# Patient Record
Sex: Female | Born: 1980 | Race: White | Hispanic: No | Marital: Single | Smoking: Never smoker
Health system: Southern US, Community
[De-identification: ages and names within clinical notes are randomized; demographics above are authoritative.]

## PROBLEM LIST (undated history)

## (undated) DIAGNOSIS — N840 Polyp of corpus uteri: Secondary | ICD-10-CM

## (undated) DIAGNOSIS — K6289 Other specified diseases of anus and rectum: Secondary | ICD-10-CM

## (undated) DIAGNOSIS — Z973 Presence of spectacles and contact lenses: Secondary | ICD-10-CM

## (undated) DIAGNOSIS — Z8619 Personal history of other infectious and parasitic diseases: Secondary | ICD-10-CM

## (undated) DIAGNOSIS — G8929 Other chronic pain: Secondary | ICD-10-CM

## (undated) DIAGNOSIS — Z9889 Other specified postprocedural states: Secondary | ICD-10-CM

## (undated) DIAGNOSIS — R112 Nausea with vomiting, unspecified: Secondary | ICD-10-CM

## (undated) DIAGNOSIS — R51 Headache: Secondary | ICD-10-CM

## (undated) DIAGNOSIS — F419 Anxiety disorder, unspecified: Secondary | ICD-10-CM

## (undated) HISTORY — DX: Personal history of other infectious and parasitic diseases: Z86.19

---

## 2006-03-13 ENCOUNTER — Ambulatory Visit (HOSPITAL_COMMUNITY): Admission: RE | Admit: 2006-03-13 | Discharge: 2006-03-13 | Payer: Self-pay | Admitting: Obstetrics and Gynecology

## 2010-03-06 ENCOUNTER — Encounter: Payer: Self-pay | Admitting: Obstetrics and Gynecology

## 2011-12-26 ENCOUNTER — Encounter (HOSPITAL_COMMUNITY): Payer: Self-pay | Admitting: Emergency Medicine

## 2011-12-26 ENCOUNTER — Observation Stay (HOSPITAL_COMMUNITY)
Admission: EM | Admit: 2011-12-26 | Discharge: 2011-12-27 | Disposition: A | Discharge status: Home / Self Care | Payer: Self-pay | Attending: Emergency Medicine | Admitting: Emergency Medicine

## 2011-12-26 DIAGNOSIS — Z792 Long term (current) use of antibiotics: Secondary | ICD-10-CM | POA: Insufficient documentation

## 2011-12-26 DIAGNOSIS — R300 Dysuria: Secondary | ICD-10-CM | POA: Insufficient documentation

## 2011-12-26 DIAGNOSIS — N12 Tubulo-interstitial nephritis, not specified as acute or chronic: Principal | ICD-10-CM

## 2011-12-26 DIAGNOSIS — R109 Unspecified abdominal pain: Secondary | ICD-10-CM | POA: Insufficient documentation

## 2011-12-26 DIAGNOSIS — R319 Hematuria, unspecified: Secondary | ICD-10-CM | POA: Insufficient documentation

## 2011-12-26 DIAGNOSIS — Z79899 Other long term (current) drug therapy: Secondary | ICD-10-CM | POA: Insufficient documentation

## 2011-12-26 NOTE — ED Notes (Signed)
PT. REPORTS LOW BACK PAIN FOR SEVERAL DAYS , SEEN BY OB/GYNE TODAY DIAGNOSED WITH UTI - PRESCRIBED WITH FLAGYL AND BACTRIM , SLIGHT NAUSEA WITH DIARRHEA.  UNSURE OF LMP.

## 2011-12-27 LAB — CBC WITH DIFFERENTIAL/PLATELET
Basophils Absolute: 0.1 10*3/uL (ref 0.0–0.1)
Basophils Relative: 0 % (ref 0–1)
HCT: 37.7 % (ref 36.0–46.0)
Hemoglobin: 13.1 g/dL (ref 12.0–15.0)
Lymphocytes Relative: 21 % (ref 12–46)
Lymphs Abs: 3 10*3/uL (ref 0.7–4.0)
MCH: 32.5 pg (ref 26.0–34.0)
MCHC: 34.7 g/dL (ref 30.0–36.0)
Monocytes Absolute: 1.2 10*3/uL — ABNORMAL HIGH (ref 0.1–1.0)
Monocytes Relative: 8 % (ref 3–12)
Neutro Abs: 10.3 10*3/uL — ABNORMAL HIGH (ref 1.7–7.7)
Platelets: 364 10*3/uL (ref 150–400)
WBC: 14.6 10*3/uL — ABNORMAL HIGH (ref 4.0–10.5)

## 2011-12-27 LAB — URINALYSIS, ROUTINE W REFLEX MICROSCOPIC
Protein, ur: 30 mg/dL — AB
Urobilinogen, UA: 0.2 mg/dL (ref 0.0–1.0)
pH: 7 (ref 5.0–8.0)

## 2011-12-27 LAB — BASIC METABOLIC PANEL
BUN: 13 mg/dL (ref 6–23)
CO2: 27 mEq/L (ref 19–32)
Creatinine, Ser: 0.95 mg/dL (ref 0.50–1.10)
GFR calc non Af Amer: 78 mL/min — ABNORMAL LOW (ref >90)
Glucose, Bld: 96 mg/dL (ref 70–99)
Potassium: 3.8 mEq/L (ref 3.5–5.1)
Sodium: 139 mEq/L (ref 135–145)

## 2011-12-27 LAB — URINE MICROSCOPIC-ADD ON

## 2011-12-27 MED ORDER — OXYCODONE-ACETAMINOPHEN 5-325 MG PO TABS
2.0000 | ORAL_TABLET | Freq: Once | ORAL | Status: AC
  Administered 2011-12-27: 2 via ORAL
  Filled 2011-12-27: qty 2

## 2011-12-27 MED ORDER — HYDROMORPHONE HCL PF 1 MG/ML IJ SOLN
1.0000 mg | INTRAMUSCULAR | Status: DC | PRN
  Administered 2011-12-27: 1 mg via INTRAVENOUS
  Filled 2011-12-27: qty 1

## 2011-12-27 MED ORDER — ONDANSETRON 4 MG PO TBDP: 4.0000 mg | ORAL_TABLET | Freq: Once | ORAL | Status: DC

## 2011-12-27 MED ORDER — ONDANSETRON HCL 4 MG/2ML IJ SOLN
4.0000 mg | Freq: Once | INTRAMUSCULAR | Status: AC
  Administered 2011-12-27: 4 mg via INTRAVENOUS
  Filled 2011-12-27: qty 2

## 2011-12-27 MED ORDER — LORAZEPAM 2 MG/ML IJ SOLN
1.0000 mg | Freq: Once | INTRAMUSCULAR | Status: AC
  Administered 2011-12-27: 1 mg via INTRAVENOUS
  Filled 2011-12-27: qty 1

## 2011-12-27 MED ORDER — HYDROMORPHONE HCL PF 1 MG/ML IJ SOLN
1.0000 mg | INTRAMUSCULAR | Status: AC
  Administered 2011-12-27: 1 mg via INTRAVENOUS
  Filled 2011-12-27: qty 1

## 2011-12-27 MED ORDER — HYDROCODONE-ACETAMINOPHEN 5-325 MG PO TABS: 2.0000 | ORAL_TABLET | ORAL | Status: DC | PRN

## 2011-12-27 MED ORDER — HYDROCODONE-ACETAMINOPHEN 5-325 MG PO TABS: 2.0000 | ORAL_TABLET | Freq: Once | ORAL | Status: DC

## 2011-12-27 MED ORDER — DEXTROSE 5 % IV SOLN
1.0000 g | Freq: Once | INTRAVENOUS | Status: AC
  Administered 2011-12-27: 1 g via INTRAVENOUS
  Filled 2011-12-27: qty 10

## 2011-12-27 MED ORDER — ONDANSETRON 4 MG PO TBDP
4.0000 mg | ORAL_TABLET | Freq: Once | ORAL | Status: AC
  Administered 2011-12-27: 4 mg via ORAL
  Filled 2011-12-27: qty 1

## 2011-12-27 MED ORDER — CIPROFLOXACIN HCL 500 MG PO TABS: 500.0000 mg | ORAL_TABLET | Freq: Two times a day (BID) | ORAL | Status: DC

## 2011-12-27 MED ORDER — CIPROFLOXACIN IN D5W 400 MG/200ML IV SOLN
400.0000 mg | Freq: Two times a day (BID) | INTRAVENOUS | Status: DC
  Administered 2011-12-27: 400 mg via INTRAVENOUS
  Filled 2011-12-27: qty 200

## 2011-12-27 MED ORDER — SODIUM CHLORIDE 0.9 % IV BOLUS (SEPSIS)
1000.0000 mL | Freq: Once | INTRAVENOUS | Status: AC
  Administered 2011-12-27: 1000 mL via INTRAVENOUS

## 2011-12-27 NOTE — ED Notes (Signed)
Pt states she is starting to feel better but "is over the eating thing". She states she tried to eat some crackers but didn't feel like it.

## 2011-12-27 NOTE — Progress Notes (Signed)
Utilization review completed.  

## 2011-12-27 NOTE — ED Notes (Signed)
Pt moved to room 17B and report given to POD B RN

## 2011-12-27 NOTE — ED Provider Notes (Signed)
Ms. Judith Jackson, is a 31 year old female in the CDU on pyelonephritis protocol. Patient was signed out to me by Dr. Dierdre Highman.  8:17 AM Patient reevaluated, she states that she is feeling better, but is still nervous that her pain is going to return.  Denies headache, chest pain, shortness of breath, nausea, vomiting, abdominal pain, constipation, diarrhea, dysuria, back pain, peripheral edema, numbness and tingling of the extremities.  She states that she can still feel a little bit of pain in her left flank. Reports small a urine.  PE: Gen: A&O x4 HEENT: PERRL, EOM CHEST: RRR, no m/r/g LUNGS: CTAB, no w/r/r ABD: BS x 4, ND/NT EXT: No edema, strong peripheral pulses NEURO: Sensation and strength intact bilaterally  Plan: By mouth challenge, reassure her, and discharged with Cipro.  9:46 AM Patient reevaluated. She states that she is starting to have return of pain. Also endorses nausea with eating. I'm going to give Zofran and Vicodin and see if her symptoms resolve.  1:34 PM Patient reports that she is feeling better. She does not have a ride home, and must drive herself. Will hold her for another hour as last dose of hydrocodone he was at 10:00.  2:45 PM Patient reports that she is feeling better. I'm going to discharge her to home with pain medicine and Cipro. Patient is agreeable with this. I will have the patient follow up with her primary care. Patient is stable and ready for discharge.  Roxy Horseman, PA-C 12/27/11 1446

## 2011-12-27 NOTE — ED Notes (Signed)
Rob, PA back in to speak with patient

## 2011-12-27 NOTE — ED Notes (Signed)
Pt states when she sits up she becomes dizzy and funny feeling but that it didn't start until after she received the last dose of medicine. States that she would like to try to fall asleep so that it might go away.

## 2011-12-27 NOTE — ED Provider Notes (Signed)
History     CSN: [id number]  Arrival date & time 12/26/11  2314   First MD Initiated Contact with Patient 12/26/11 2339      Chief Complaint  Patient presents with  . Back Pain    (Consider location/radiation/quality/duration/timing/severity/associated sxs/prior treatment) HPI Comments: Judith Jackson 31 y.o. female   The chief complaint is: Patient presents with:   Back Pain    Cc Back pain. 8/10. Nothing makes it worse  Or better. Tried heating pad without relief.  Onset Saturday in Left flank.  Gradually moved to left flank.  Pain gradually worsening.   Pain with urination, foul odor of urine 2 weeks ago.  + suprapubic pain and + N -V.  + pink urine last week/  Denies fevers, chills, myalgias, arthralgias. Denies DOE, SOB, chest tightness or pressure, radiation to left arm, jaw or bac, or diaphoresis. Denies headaches, light headedness, weakness, visual disturbances. Denies abdominal pain, nausea, kvomiting, diarrhea or constipation.      The history is provided by the patient. No language interpreter was used.    History reviewed. No pertinent past medical history.  History reviewed. No pertinent past surgical history.  No family history on file.  History  Substance Use Topics  . Smoking status: Never Smoker   . Smokeless tobacco: Not on file  . Alcohol Use: No    OB History    Grav Para Term Preterm Abortions TAB SAB Ect Mult Living                  Review of Systems  Constitutional: Negative.   HENT: Negative.   Eyes: Negative.   Respiratory: Negative.   Cardiovascular: Negative.   Gastrointestinal: Positive for abdominal pain.       Suprapubic   Genitourinary: Positive for dysuria, hematuria and flank pain.  Musculoskeletal: Negative.   Skin: Negative.   Neurological: Negative.   All other systems reviewed and are negative.    Allergies  Demerol  Home Medications   Current Outpatient Rx  Name  Route  Sig  Dispense  Refill  .  METRONIDAZOLE 500 MG PO TABS   Oral   Take 500 mg by mouth 2 (two) times daily.         . SULFAMETHOXAZOLE-TMP DS 800-160 MG PO TABS   Oral   Take 1 tablet by mouth 2 (two) times daily.           BP 146/90  Pulse 89  Temp 98.5 F (36.9 C) (Oral)  Resp 18  SpO2 98%  Physical Exam  Constitutional: She is oriented to person, place, and time. She appears well-developed and well-nourished. No distress.  HENT:  Head: Normocephalic and atraumatic.  Eyes: Conjunctivae normal are normal. No scleral icterus.  Neck: Normal range of motion.  Cardiovascular: Normal rate, regular rhythm and normal heart sounds.  Exam reveals no gallop and no friction rub.   No murmur heard. Pulmonary/Chest: Effort normal and breath sounds normal. No respiratory distress.  Abdominal: Soft. Bowel sounds are normal. She exhibits no distension and no mass. There is tenderness. There is no guarding.       Positive CVA angle tenderness.  Neurological: She is alert and oriented to person, place, and time.  Skin: Skin is warm and dry. She is not diaphoretic.    ED Course  Procedures (including critical care time) Results for orders placed during the hospital encounter of 12/26/11  URINALYSIS, ROUTINE W REFLEX MICROSCOPIC      Component Value Range  Color, Urine YELLOW  YELLOW   APPearance CLOUDY (*) CLEAR   Specific Gravity, Urine 1.021  1.005 - 1.030   pH 7.0  5.0 - 8.0   Glucose, UA NEGATIVE  NEGATIVE mg/dL   Hgb urine dipstick MODERATE (*) NEGATIVE   Bilirubin Urine NEGATIVE  NEGATIVE   Ketones, ur NEGATIVE  NEGATIVE mg/dL   Protein, ur 30 (*) NEGATIVE mg/dL   Urobilinogen, UA 0.2  0.0 - 1.0 mg/dL   Nitrite NEGATIVE  NEGATIVE   Leukocytes, UA MODERATE (*) NEGATIVE  POCT PREGNANCY, URINE      Component Value Range   Preg Test, Ur NEGATIVE  NEGATIVE  URINE MICROSCOPIC-ADD ON      Component Value Range   Squamous Epithelial / LPF FEW (*) RARE   WBC, UA 11-20  <3 WBC/hpf   RBC / HPF 3-6  <3  RBC/hpf   Bacteria, UA FEW (*) RARE   Urine-Other AMORPHOUS URATES/PHOSPHATES    CBC WITH DIFFERENTIAL      Component Value Range   WBC 14.6 (*) 4.0 - 10.5 K/uL   RBC 4.03  3.87 - 5.11 MIL/uL   Hemoglobin 13.1  12.0 - 15.0 g/dL   HCT 40.9  81.1 - 91.4 %   MCV 93.5  78.0 - 100.0 fL   MCH 32.5  26.0 - 34.0 pg   MCHC 34.7  30.0 - 36.0 g/dL   RDW 78.2  95.6 - 21.3 %   Platelets 364  150 - 400 K/uL   Neutrophils Relative 70  43 - 77 %   Neutro Abs 10.3 (*) 1.7 - 7.7 K/uL   Lymphocytes Relative 21  12 - 46 %   Lymphs Abs 3.0  0.7 - 4.0 K/uL   Monocytes Relative 8  3 - 12 %   Monocytes Absolute 1.2 (*) 0.1 - 1.0 K/uL   Eosinophils Relative 1  0 - 5 %   Eosinophils Absolute 0.2  0.0 - 0.7 K/uL   Basophils Relative 0  0 - 1 %   Basophils Absolute 0.1  0.0 - 0.1 K/uL  BASIC METABOLIC PANEL      Component Value Range   Sodium 139  135 - 145 mEq/L   Potassium 3.8  3.5 - 5.1 mEq/L   Chloride 101  96 - 112 mEq/L   CO2 27  19 - 32 mEq/L   Glucose, Bld 96  70 - 99 mg/dL   BUN 13  6 - 23 mg/dL   Creatinine, Ser 0.86  0.50 - 1.10 mg/dL   Calcium 9.7  8.4 - 57.8 mg/dL   GFR calc non Af Amer 78 (*) >90 mL/min   GFR calc Af Amer >90  >90 mL/min     No results found.   No diagnosis found.    MDM  Patient with Positive UTI and sig. Pain.  Denies vaginal sxs.  Poor insight.  Had pelvic done today at ob/gyn and gc/chlamydia cultures.  VSS but positive CVA angle tenderness and likely Pyelo.  Sig Pain.  I will give IV Rocephin.    Filed Vitals:   12/27/11 0215 12/27/11 0230 12/27/11 0300 12/27/11 0309  BP: 112/63 99/63 103/60   Pulse: 69 64 61   Temp:    98.8 F (37.1 C)  TempSrc:    Rectal  Resp:      SpO2: 98% 98% 97%     Patient with likley Pyelo.  I am moving her to CDU on Pyelo protocol pending Iv ABX. Patient has  gotten 1 dose of rocephin IV.  WIl receive 1 dose IV cipro in the AM.   Arthor Captain, PA-C 12/27/11 1434

## 2011-12-27 NOTE — ED Notes (Signed)
Pt requesting a female MD to do pelvic exam. MD notified

## 2011-12-27 NOTE — ED Notes (Signed)
Pt is here with lower back and had some diarrhea since Saturday.  Patient moved to CDU.  Pt is nauseated and only pain is lower back.

## 2011-12-27 NOTE — ED Notes (Signed)
MD at bedside. 

## 2011-12-28 LAB — URINE CULTURE

## 2011-12-28 NOTE — ED Provider Notes (Signed)
Medical screening examination/treatment/procedure(s) were conducted as a shared visit with non-physician practitioner(s) and myself.  I personally evaluated the patient during the encounter. PT evaluated for back pain and dysuria, has L side CVAT, heart RRR, lungs CTA. ABD s/n/nd. IVFs, Iv ABx. Pain medications provided.  Observation Protocol  Sunnie Nielsen, MD 12/28/11 [phone]

## 2011-12-28 NOTE — ED Provider Notes (Signed)
Medical screening examination/treatment/procedure(s) were conducted as a shared visit with non-physician practitioner(s) and myself.  I personally evaluated the patient during the encounter  Sunnie Nielsen, MD 12/28/11 [phone]

## 2012-05-11 ENCOUNTER — Emergency Department (HOSPITAL_COMMUNITY)
Admission: EM | Admit: 2012-05-11 | Discharge: 2012-05-12 | Disposition: A | Discharge status: Home / Self Care | Payer: Self-pay | Attending: Emergency Medicine | Admitting: Emergency Medicine

## 2012-05-11 ENCOUNTER — Encounter (HOSPITAL_COMMUNITY): Payer: Self-pay | Admitting: *Deleted

## 2012-05-11 DIAGNOSIS — Z7251 High risk heterosexual behavior: Secondary | ICD-10-CM | POA: Insufficient documentation

## 2012-05-11 DIAGNOSIS — A630 Anogenital (venereal) warts: Secondary | ICD-10-CM

## 2012-05-11 DIAGNOSIS — K5900 Constipation, unspecified: Secondary | ICD-10-CM | POA: Insufficient documentation

## 2012-05-11 DIAGNOSIS — K6289 Other specified diseases of anus and rectum: Secondary | ICD-10-CM

## 2012-05-11 DIAGNOSIS — N898 Other specified noninflammatory disorders of vagina: Secondary | ICD-10-CM | POA: Insufficient documentation

## 2012-05-11 DIAGNOSIS — K602 Anal fissure, unspecified: Secondary | ICD-10-CM

## 2012-05-11 LAB — WET PREP, GENITAL
Trich, Wet Prep: NONE SEEN
Yeast Wet Prep HPF POC: NONE SEEN

## 2012-05-11 LAB — URINALYSIS, ROUTINE W REFLEX MICROSCOPIC
Bilirubin Urine: NEGATIVE
Glucose, UA: NEGATIVE mg/dL
Protein, ur: NEGATIVE mg/dL
Urobilinogen, UA: 0.2 mg/dL (ref 0.0–1.0)

## 2012-05-11 LAB — COMPREHENSIVE METABOLIC PANEL
AST: 15 U/L (ref 0–37)
Alkaline Phosphatase: 74 U/L (ref 39–117)
Calcium: 10.1 mg/dL (ref 8.4–10.5)
Creatinine, Ser: 0.78 mg/dL (ref 0.50–1.10)
GFR calc Af Amer: >90 mL/min (ref >90)

## 2012-05-11 LAB — CBC
MCHC: 34.1 g/dL (ref 30.0–36.0)
RBC: 4.09 MIL/uL (ref 3.87–5.11)
RDW: 12.8 % (ref 11.5–15.5)

## 2012-05-11 LAB — URINE MICROSCOPIC-ADD ON

## 2012-05-11 LAB — PREGNANCY, URINE: Preg Test, Ur: NEGATIVE

## 2012-05-11 MED ORDER — LIDOCAINE 5 % EX OINT: TOPICAL_OINTMENT | CUTANEOUS | Status: DC | PRN

## 2012-05-11 MED ORDER — DOCUSATE SODIUM 100 MG PO CAPS: 100.0000 mg | ORAL_CAPSULE | Freq: Two times a day (BID) | ORAL | Status: DC

## 2012-05-11 NOTE — ED Notes (Signed)
Pt reports having rectal pain x 2 weeks when having a bowel movement, reports its sharp like a razor blade and now having rectal bleeding. Reports something is hanging out of rectum, possible hemorrhoid. Pt is also requesting female provider.

## 2012-05-11 NOTE — ED Provider Notes (Signed)
History     CSN: [id number]  Arrival date & time 05/11/12  1706   First MD Initiated Contact with Patient 05/11/12 2003      Chief Complaint  Patient presents with  . Rectal Bleeding    (Consider location/radiation/quality/duration/timing/severity/associated sxs/prior treatment) HPI Comments: Patient presents for 2 weeks of rectal pain and BRBPR with bowel movements. She states that when she wipes she has noticed bright red blood on her toilet tissue. Per patient, symptoms have been unchanged since onset without any aggravating or alleviating factors. Patient admits to associated constipation, stating that she frequently is constipated and sometimes goes ~3 days without a BM; last BM this AM. Patient denies fever, chest pain, abdominal pain, pelvic pain, vaginal bleeding, dysuria, hematuria, melena, and numbness or tingling in her extremities. She later admits to having some white mucousy-like d/c x 1 week and admits to unprotected sex with 2 partners in the last year. Patient denies seeing an OBGYN for regularly scheduled pap smears in "years".  The history is provided by the patient. No language interpreter was used.    History reviewed. No pertinent past medical history.  History reviewed. No pertinent past surgical history.  History reviewed. No pertinent family history.  History  Substance Use Topics  . Smoking status: Never Smoker   . Smokeless tobacco: Not on file  . Alcohol Use: No    OB History   Grav Para Term Preterm Abortions TAB SAB Ect Mult Living                  Review of Systems  Constitutional: Negative for fever and chills.  Respiratory: Negative for chest tightness and shortness of breath.   Cardiovascular: Negative for chest pain.  Gastrointestinal: Positive for constipation and anal bleeding. Negative for nausea, vomiting, abdominal pain and diarrhea.  Genitourinary: Positive for vaginal discharge. Negative for dysuria, hematuria, flank pain, vaginal  bleeding, vaginal pain and pelvic pain.  Musculoskeletal: Negative for back pain.  Neurological: Negative for dizziness, syncope, weakness, light-headedness and numbness.  All other systems reviewed and are negative.    Allergies  Demerol  Home Medications   Current Outpatient Rx  Name  Route  Sig  Dispense  Refill  . ibuprofen (ADVIL,MOTRIN) 200 MG tablet   Oral   Take 600 mg by mouth every 6 (six) hours as needed for pain.         . pseudoephedrine (SUDAFED) 30 MG tablet   Oral   Take 30 mg by mouth every 4 (four) hours as needed for congestion.         . docusate sodium (COLACE) 100 MG capsule   Oral   Take 1 capsule (100 mg total) by mouth every 12 (twelve) hours.   30 capsule   0   . lidocaine (XYLOCAINE) 5 % ointment   Topical   Apply topically as needed.   35.44 g   0     BP 102/70  Pulse 79  Temp(Src) 98.3 F (36.8 C) (Oral)  Resp 16  SpO2 99%  LMP 04/29/2012  Physical Exam  Nursing note and vitals reviewed. Constitutional: She is oriented to person, place, and time. She appears well-developed and well-nourished.  HENT:  Head: Normocephalic and atraumatic.  Mouth/Throat: Oropharynx is clear and moist. No oropharyngeal exudate.  Eyes: Conjunctivae are normal. Pupils are equal, round, and reactive to light. No scleral icterus.  Neck: Normal range of motion. Neck supple.  Cardiovascular: Normal rate, regular rhythm, normal heart sounds and intact  distal pulses.   Pulmonary/Chest: Effort normal and breath sounds normal. No respiratory distress. She has no wheezes. She has no rales.  Abdominal: Soft. Bowel sounds are normal. She exhibits no distension. There is no tenderness. There is no rebound and no guarding.  Genitourinary: Rectal exam shows fissure. Rectal exam shows no external hemorrhoid, no internal hemorrhoid and anal tone normal. There is no rash, tenderness or lesion on the right labia. There is no rash or lesion on the left labia. Uterus is  not tender. Cervix exhibits discharge. Cervix exhibits no motion tenderness and no friability. Right adnexum displays no mass, no tenderness and no fullness. Left adnexum displays no mass, no tenderness and no fullness. No erythema, tenderness or bleeding around the vagina. No foreign body around the vagina. No signs of injury around the vagina. Vaginal discharge found.  Patient with opaque mucousy discharge from cervix and in vaginal vault; no CMT or adnexal tenderness.  Three anal warts appreciated at the anterior aspect of the patient's rectum. Anal fissure also appreciated at posterior aspect of patient's rectum with mild bleeding. Rectal tone normal and stool brown in color; streaked with bright red blood from patient's mildly bleeding fissure.  Musculoskeletal: Normal range of motion. She exhibits no edema.  Lymphadenopathy:    She has no cervical adenopathy.  Neurological: She is alert and oriented to person, place, and time.  Skin: Skin is warm and dry.  Psychiatric: She has a normal mood and affect. Her behavior is normal.    ED Course  Procedures (including critical care time)  Labs Reviewed  WET PREP, GENITAL - Abnormal; Notable for the following:    WBC, Wet Prep HPF POC MANY (*)    All other components within normal limits  URINALYSIS, ROUTINE W REFLEX MICROSCOPIC - Abnormal; Notable for the following:    APPearance TURBID (*)    Hgb urine dipstick MODERATE (*)    Leukocytes, UA MODERATE (*)    All other components within normal limits  OCCULT BLOOD, POC DEVICE - Abnormal; Notable for the following:    Fecal Occult Bld POSITIVE (*)    All other components within normal limits  GC/CHLAMYDIA PROBE AMP  CBC  COMPREHENSIVE METABOLIC PANEL  URINE MICROSCOPIC-ADD ON  PREGNANCY, URINE   No results found.   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 Rectal pain   3. Anal fissure      MDM  Patient presents with rectal pain with defecation and BRBPR x 2 weeks. Anal fissure  appreciated on posterior aspect of patient's rectum, mildly bleeding, consistent with patient's symptoms. Hemodynamically stable without anemia. Patient's exam also significant for condyloma acuminata on the anterior aspect of her rectum requiring follow up with dermatology. No CMT or adnexal tenderness on pelvic exam; GC/Chlamydia cultures pending. Patient stable for d/c with PCP, OBGYN, and dermatology follow up as well as colace and lidocaine ointment for symptomatic improvement. Patient instructed to f/u regarding GC/Chlamdia results. Patient states comfort and understanding with this d/c plan with no unaddressed concerns. Indications for ED return discussed. Patient work up and ED management discussed with Dr. Blinda Leatherwood who is in agreement.        Antony Madura, PA-C 05/12/12 1301

## 2012-05-11 NOTE — ED Notes (Signed)
Patient requested to see female provider, no active bleeding at this time.  Patient does report having constipation and straining while attempting to have bowel movement.  Patient also is c/o of white foul smelling discharge.

## 2012-05-12 ENCOUNTER — Telehealth (HOSPITAL_COMMUNITY): Payer: Self-pay | Admitting: Emergency Medicine

## 2012-05-12 NOTE — ED Notes (Signed)
Calling about test results

## 2012-05-12 NOTE — ED Provider Notes (Signed)
Medical screening examination/treatment/procedure(s) were performed by non-physician practitioner and as supervising physician I was immediately available for consultation/collaboration.   Christopher J. Pollina, MD 05/12/12 1608 

## 2012-05-13 LAB — GC/CHLAMYDIA PROBE AMP: CT Probe RNA: NEGATIVE

## 2012-05-15 ENCOUNTER — Telehealth (HOSPITAL_COMMUNITY): Payer: Self-pay | Admitting: Emergency Medicine

## 2012-05-15 NOTE — ED Notes (Signed)
Pt calling for STD results.  ID verified w/DOB and address.  Pt informed STD results negative.

## 2012-06-24 ENCOUNTER — Encounter: Payer: Self-pay | Admitting: Obstetrics and Gynecology

## 2012-06-24 NOTE — Progress Notes (Signed)
Patient ID: Judith Jackson, female   DOB: [birth year], 32 y.o.   MRN: [id number] [facility] referred patient to see Dr. Stan Head.  Records were sent over for review.  Per Dr. Stan Head we do not treat rectal warts, patient would be best served seeing Dr. Romie Levee at [facility].  This information was given to [facility] that works with Dr. Nils Flack along with phone and fax numbers for [facility].  Melissa said she will notify patient of this information and she will work on getting an appointment set up for patient with [facility].

## 2012-07-12 ENCOUNTER — Ambulatory Visit (INDEPENDENT_AMBULATORY_CARE_PROVIDER_SITE_OTHER): Payer: BC Managed Care – PPO | Admitting: General Surgery

## 2012-07-12 ENCOUNTER — Encounter (INDEPENDENT_AMBULATORY_CARE_PROVIDER_SITE_OTHER): Payer: Self-pay | Admitting: General Surgery

## 2012-07-12 VITALS — BP 110/70 | HR 72 | Temp 98.0°F | Resp 18 | Ht 62.0 in | Wt 145.0 lb

## 2012-07-12 DIAGNOSIS — A630 Anogenital (venereal) warts: Secondary | ICD-10-CM

## 2012-07-12 NOTE — Progress Notes (Signed)
Chief Complaint  Patient presents with  . Other    Anal Condyloma    HISTORY: Judith Jackson is a 32 y.o. female who presents to the office with rectal bleeding and anal condyloma.  Other symptoms include pain.  This had been occurring for several months.  She has tried chemical ablation and cryosurgery in the past with no success.  Nothing makes the symptoms worse.   It is intermittent in nature.  Her bowel habits are reg and her bowel movements are soft.  She denies any risk factors for HIV.  No past medical history     No past surgical history    No current outpatient prescriptions on file.   No current facility-administered medications for this visit.      Allergies  Allergen Reactions  . Demerol (Meperidine) Other (See Comments)    halluninations      No family history on file.  History   Social History  . Marital Status: Divorced    Spouse Name: N/A    Number of Children: N/A  . Years of Education: N/A   Social History Main Topics  . Smoking status: Never Smoker   . Smokeless tobacco: None  . Alcohol Use: Yes  . Drug Use: No  . Sexually Active: None   Other Topics Concern  . None   Social History Narrative  . None      REVIEW OF SYSTEMS - PERTINENT POSITIVES ONLY: Review of Systems - General ROS: negative for - chills, fever or weight loss Hematological and Lymphatic ROS: negative for - bleeding problems, blood clots or bruising Respiratory ROS: no cough, shortness of breath, or wheezing Cardiovascular ROS: no chest pain or dyspnea on exertion Gastrointestinal ROS: no abdominal pain, change in bowel habits, or black or bloody stools Genito-Urinary ROS: no dysuria, trouble voiding, or hematuria  EXAM: Filed Vitals:   07/12/12 1601  BP: 110/70  Pulse: 72  Temp: 98 F (36.7 C)  Resp: 18    General appearance: alert and cooperative Resp: clear to auscultation bilaterally Cardio: regular rate and rhythm GI: soft, non-tender; bowel sounds normal; no  masses,  no organomegaly   Procedure: Anoscopy Surgeon: Maisie Fus Diagnosis: anal warts  Assistant: Christella Scheuermann After the risks and benefits were explained, verbal consent was obtained for above procedure  Anesthesia: none Findings: anal condyloma, mild sphincter hypertension, anterior internal lesion.    ASSESSMENT AND PLAN: Judith Jackson is a 32 y.o. F with anal condyloma.  These have not gotten better with conservative treatments.  I have recommended a HRA with laser ablation.  We discussed the management of anal warts. We discussed chemical destruction, immunotherapy, and surgical excision. I discussed the pros and cons of each approach. We discussed the risk and benefits and the expected outcome with chemical destruction with agents such as podophyllin. I explained that podophyllin is generally not been effective and has a high recurrence rate. We discussed the use of Aldara ointment. I explained that it has a 30-70% chance at resolving or at least reducing the number of anal warts. I explained that it is applied 3 times a week at night and left on overnight. I explained that skin irritation is the most common side effect. We then discussed surgical excision specifically excision and fulguration. I explained how the surgery is performed. I explained that it can be painful however it generally has the highest success rate. We discussed the risk and benefits of surgery including but not limited to bleeding, infection, injury to surrounding  structures, need to do a formal anoscopic exam to evaluate for anal canal warts, urinary retention, wart recurrence, and general anesthesia risk. We discussed the typical aftercare.      Vanita Panda, MD Colon and Rectal Surgery / [facility]      Visit Diagnoses: 1. Anal condyloma     Primary Care Physician: No primary provider on file.

## 2012-07-12 NOTE — Patient Instructions (Addendum)
Anal Warts  What are anal warts? Anal warts (also called "condyloma acuminata") are a condition that affects the area around and inside the anus. They may also affect the skin of the genital area. They first appear as tiny spots or growths, perhaps as small as the head of a pin, and may grow quite large and cover the entire anal area. Usually, they do not cause pain or discomfort to afflicted individuals and patients may be unaware that the warts are present. Some patients will experience symptoms, such as itching, bleeding, mucus discharge and/or a feeling of a lump or mass in the anal area.  What causes anal warts? They are caused by the human papilloma virus (HPV), which is transmitted from person to person by direct contact. HPV is considered a sexually transmitted disease (STD). You do not have to have anal intercourse to develop anal warts. Do anal warts always need to be removed? Yes. If they are not removed, the warts usually grow larger and multiply. Left untreated, the warts may lead to an increased risk of cancer in the affected area. What treatments are available? If warts are very small and are located only on the skin around the anus, they may be treated with a topical medication. They may also be treated by freezing the warts with liquid nitrogen or removed surgically. Surgery typically involves cutting or burning the warts off. While this provides immediate results, it must be performed using either a local anesthetic - such as novocaine - or a general or spinal anesthetic, depending on the number and exact location of warts being treated. It is important that an internal anal examination with an instrument called an anoscope be done by your treating physician to ensure you do not have any inside the anal canal (internal anal warts). Internal anal warts may not be as suitable for treatment by topical medications, and may need to be treated surgically. Additionally, your physician may wish to  examine the entire pelvic region to include the vaginal or penile area to look for other warts that may require treatment. Must I be hospitalized for surgical treatment? Surgical treatment of anal warts is usually performed as outpatient surgery. How much time will I lose from work after surgical treatment? Most people are moderately uncomfortable for a few days after treatment and pain medication may be prescribed. Depending on the extent of the disease, some people return to work the next day, while others may remain out of work for several days to weeks. Will a single treatment cure the problem? When warts are extensive, your surgeon may wish to perform the surgery in stages. Additionally, recurrent warts are common. The virus that causes the warts can live concealed in tissues that appear normal for several months before another wart develops. As new warts develop, they usually can be treated in the physician's office. Sometimes new warts develop so rapidly that office treatment would be quite uncomfortable. In these situations, a second and, occasionally, third outpatient surgical visit may be recommended. How long is treatment usually continued? Follow-up visits are necessary at frequent intervals for several months after all warts appear to be gone, to be certain that no new warts occur. What can be done to avoid getting these warts again? In some cases, warts may recur repeatedly after successful removal, since the virus that causes the warts often persists in a dormant state in body tissues. Discuss with your physician how often you should be evaluated for recurrent warts. Abstain from sexual   contact with individuals who have anal (or genital) warts. Since many individuals may be unaware that they suffer from this condition, sexual abstinence, condom protection or limiting sexual contact to single partner will reduce your potential exposure to the contagious virus that causes these warts. As a  precaution, sexual partners ought to be checked for warts and other sexual transmitted diseases, even if they have no symptoms. What is a colon and rectal surgeon? Colon and rectal surgeons are experts in the surgical and non-surgical treatment of diseases of the colon, rectum and anus. They have completed advanced surgical training in the treatment of these diseases as well as full general surgical training. Board-certified colon and rectal surgeons complete residencies in general surgery and colon and rectal surgery, and pass intensive examinations conducted by the American Board of Surgery and the American Board of Colon and Rectal Surgery. They are well-versed in the treatment of both benign and malignant diseases of the colon, rectum and anus and are able to perform routine screening examinations and surgically treat conditions if indicated to do so. author: Jennifer Lowney, MD, FASCRS, on behalf of the ASCRS Public Relations Committee  2012 American Society of Colon & Rectal Surgeons   

## 2012-09-27 ENCOUNTER — Telehealth (INDEPENDENT_AMBULATORY_CARE_PROVIDER_SITE_OTHER): Payer: Self-pay | Admitting: General Surgery

## 2012-09-27 NOTE — Telephone Encounter (Signed)
Pt advised of financial obligation for surgery / aware that orders only active for 90 days then need to see Dr

## 2012-10-10 ENCOUNTER — Emergency Department (INDEPENDENT_AMBULATORY_CARE_PROVIDER_SITE_OTHER)
Admission: EM | Admit: 2012-10-10 | Discharge: 2012-10-10 | Disposition: A | Discharge status: Home / Self Care | Payer: BC Managed Care – PPO | Source: Home / Self Care | Attending: Family Medicine | Admitting: Family Medicine

## 2012-10-10 ENCOUNTER — Encounter (HOSPITAL_COMMUNITY): Payer: Self-pay | Admitting: *Deleted

## 2012-10-10 DIAGNOSIS — M542 Cervicalgia: Secondary | ICD-10-CM

## 2012-10-10 DIAGNOSIS — R110 Nausea: Secondary | ICD-10-CM

## 2012-10-10 DIAGNOSIS — M549 Dorsalgia, unspecified: Secondary | ICD-10-CM

## 2012-10-10 MED ORDER — GI COCKTAIL ~~LOC~~
ORAL | Status: AC
  Filled 2012-10-10: qty 30

## 2012-10-10 MED ORDER — ONDANSETRON HCL 4 MG PO TABS: 4.0000 mg | ORAL_TABLET | Freq: Four times a day (QID) | ORAL | Status: DC

## 2012-10-10 MED ORDER — DICLOFENAC POTASSIUM 50 MG PO TABS: 50.0000 mg | ORAL_TABLET | Freq: Three times a day (TID) | ORAL | Status: DC

## 2012-10-10 MED ORDER — GI COCKTAIL ~~LOC~~
30.0000 mL | Freq: Once | ORAL | Status: AC
  Administered 2012-10-10: 30 mL via ORAL

## 2012-10-10 MED ORDER — ONDANSETRON 4 MG PO TBDP
4.0000 mg | ORAL_TABLET | Freq: Once | ORAL | Status: AC
  Administered 2012-10-10: 4 mg via ORAL

## 2012-10-10 MED ORDER — ACETAMINOPHEN 325 MG PO TABS
ORAL_TABLET | ORAL | Status: AC
  Filled 2012-10-10: qty 3

## 2012-10-10 MED ORDER — ACETAMINOPHEN 325 MG PO TABS
975.0000 mg | ORAL_TABLET | Freq: Once | ORAL | Status: AC
  Administered 2012-10-10: 975 mg via ORAL

## 2012-10-10 MED ORDER — ONDANSETRON 4 MG PO TBDP
ORAL_TABLET | ORAL | Status: AC
  Filled 2012-10-10: qty 1

## 2012-10-10 MED ORDER — CYCLOBENZAPRINE HCL 5 MG PO TABS: 5.0000 mg | ORAL_TABLET | Freq: Three times a day (TID) | ORAL | Status: DC | PRN
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
## 2012-11-13 ENCOUNTER — Encounter (INDEPENDENT_AMBULATORY_CARE_PROVIDER_SITE_OTHER): Payer: Self-pay | Admitting: General Surgery

## 2012-11-13 ENCOUNTER — Ambulatory Visit (INDEPENDENT_AMBULATORY_CARE_PROVIDER_SITE_OTHER): Payer: BC Managed Care – PPO | Admitting: General Surgery

## 2012-11-13 ENCOUNTER — Encounter (INDEPENDENT_AMBULATORY_CARE_PROVIDER_SITE_OTHER): Payer: Self-pay

## 2012-11-13 VITALS — BP 122/70 | HR 84 | Resp 16 | Ht 62.0 in | Wt 148.2 lb

## 2012-11-13 DIAGNOSIS — K645 Perianal venous thrombosis: Secondary | ICD-10-CM

## 2012-11-13 MED ORDER — HYDROCORTISONE 2.5 % RE CREA: TOPICAL_CREAM | Freq: Two times a day (BID) | RECTAL | Status: DC

## 2012-11-13 MED ORDER — LIDOCAINE 5 % EX OINT: TOPICAL_OINTMENT | CUTANEOUS | Status: DC | PRN

## 2012-11-13 NOTE — Patient Instructions (Signed)
Fiber Chart  You should 25-30g of fiber per day and drinking 8 glasses of water to help your bowels move regularly.  In the chart below you can look up how much fiber you are getting in an average day.  If you are not getting enough fiber, you should add a fiber supplement to your diet.  Examples of this include Metamucil, FiberCon and Citrucel.  These can be purchased at your local grocery store or pharmacy.      http://www.[employer].edu/offices/health/nutritioncoach/AtoZ/handouts/Fiber.pdf   GETTING TO GOOD BOWEL HEALTH. Irregular bowel habits such as constipation can lead to many problems over time.  Having one soft bowel movement a day is the most important way to prevent further problems.  The anorectal canal is designed to handle stretching and feces to safely manage our ability to get rid of solid waste (feces, poop, stool) out of our body.  BUT, hard constipated stools can act like ripping concrete bricks causing inflamed hemorrhoids, anal fissures, abdominal pain and bloating.     The goal: ONE SOFT BOWEL MOVEMENT A DAY!  To have soft, regular bowel movements:    Drink at least 8 tall glasses of water a day.     Take plenty of fiber.  Fiber is the undigested part of plant food that passes into the colon, acting s "natures broom" to encourage bowel motility and movement.  Fiber can absorb and hold large amounts of water. This results in a larger, bulkier stool, which is soft and easier to pass. Work gradually over several weeks up to 6 servings a day of fiber (25g a day even more if needed) in the form of: o Vegetables -- Root (potatoes, carrots, turnips), leafy green (lettuce, salad greens, celery, spinach), or cooked high residue (cabbage, broccoli, etc) o Fruit -- Fresh (unpeeled skin & pulp), Dried (prunes, apricots, cherries, etc ),  or stewed ( applesauce)  o Whole grain breads, pasta, etc (whole wheat)  o Bran cereals    Bulking Agents -- This type of water-retaining fiber generally is  easily obtained each day by one of the following:  o Psyllium bran -- The psyllium plant is remarkable because its ground seeds can retain so much water. This product is available as Metamucil, Konsyl, Effersyllium, Per Diem Fiber, or the less expensive generic preparation in drug and health food stores. Although labeled a laxative, it really is not a laxative.  o Methylcellulose -- This is another fiber derived from wood which also retains water. It is available as Citrucel. o Polyethylene Glycol - and "artificial" fiber commonly called Miralax or Glycolax.  It is helpful for people with gassy or bloated feelings with regular fiber o Flax Seed - a less gassy fiber than psyllium   No reading or other relaxing activity while on the toilet. If bowel movements take longer than 5 minutes, you are too constipated   AVOID CONSTIPATION.  High fiber and water intake usually takes care of this.  Sometimes a laxative is needed to stimulate more frequent bowel movements, but    Laxatives are not a good long-term solution as it can wear the colon out. o Osmotics (Milk of Magnesia, Fleets phosphosoda, Magnesium citrate, MiraLax, GoLytely) are safer than  o Stimulants (Senokot, Castor Oil, Dulcolax, Ex Lax)    o Do not take laxatives for more than 7days in a row.    IF SEVERELY CONSTIPATED, try a Bowel Retraining Program: o Do not use laxatives.  o Eat a diet high in roughage, such as   bran cereals and leafy vegetables.  o Drink six (6) ounces of prune or apricot juice each morning.  o Eat two (2) large servings of stewed fruit each day.  o Take one (1) heaping tablespoon of a psyllium-based bulking agent twice a day. Use sugar-free sweetener when possible to avoid excessive calories.  o Eat a normal breakfast.  o Set aside 15 minutes after breakfast to sit on the toilet, but do not strain to have a bowel movement.  o If you do not have a bowel movement by the third day, use an enema and repeat the above steps.     HEMORRHOIDS    Did you know... Hemorrhoids are one of the most common ailments known.  More than half the population will develop hemorrhoids, usually after age 30.  Millions of Americans currently suffer from hemorrhoids.  The average person suffers in silence for a long period before seeking medical care.  Today's treatment methods make some types of hemorrhoid removal much less painful.  What are hemorrhoids? Often described as "varicose veins of the anus and rectum", hemorrhoids are enlarged, bulging blood vessels in and about the anus and lower rectum. There are two types of hemorrhoids: external and internal, which refer to their location.  External (outside) hemorrhoids develop near the anus and are covered by very sensitive skin. These are usually painless. However, if a blood clot (thrombosis) develops in an external hemorrhoid, it becomes a painful, hard lump. The external hemorrhoid may bleed if it ruptures. Internal (inside) hemorrhoids develop within the anus beneath the lining. Painless bleeding and protrusion during bowel movements are the most common symptom. However, an internal hemorrhoid can cause severe pain if it is completely "prolapsed" - protrudes from the anal opening and cannot be pushed back inside.   What causes hemorrhoids? An exact cause is unknown; however, the upright posture of humans alone forces a great deal of pressure on the rectal veins, which sometimes causes them to bulge. Other contributing factors include:  . Aging  . Chronic constipation or diarrhea  . Pregnancy  . Heredity  . Straining during bowel movements  . Faulty bowel function due to overuse of laxatives or enemas . Spending long periods of time (e.g., reading) on the toilet  Whatever the cause, the tissues supporting the vessels stretch. As a result, the vessels dilate; their walls become thin and bleed. If the stretching and pressure continue, the weakened vessels protrude.  What are  the symptoms? If you notice any of the following, you could have hemorrhoids:  . Bleeding during bowel movements  . Protrusion during bowel movements . Itching in the anal area  . Pain  . Sensitive lump(s)  How are hemorrhoids treated? Mild symptoms can be relieved frequently by increasing the amount of fiber (e.g., fruits, vegetables, breads and cereals) and fluids in the diet. Eliminating excessive straining reduces the pressure on hemorrhoids and helps prevent them from protruding. A sitz bath - sitting in plain warm water for about 10 minutes - can also provide some relief . With these measures, the pain and swelling of most symptomatic hemorrhoids will decrease in two to seven days, and the firm lump should recede within four to six weeks. In cases of severe or persistent pain from a thrombosed hemorrhoid, your physician may elect to remove the hemorrhoid containing the clot with a small incision. Performed under local anesthesia as an outpatient, this procedure generally provides relief. Severe hemorrhoids may require special treatment, much of which can   be performed on an outpatient basis.  . Ligation - the rubber band treatment - works effectively on internal hemorrhoids that protrude with bowel movements. A small rubber band is placed over the hemorrhoid, cutting off its blood supply. The hemorrhoid and the band fall off in a few days and the wound usually heals in a week or two. This procedure sometimes produces mild discomfort and bleeding and may need to be repeated for a full effect.  There is a more intense version of this procedure that is done in the OR as outpatient surgery called THD.  It involves identifying blood vessels leading to the hemorrhoids and then tying them off with sutures.  This method is a little more painful than rubber band ligation but less painful than traditional hemorrhoidectomy and usually does not have to be repeated.  It is best for internal hemorrhoids that  bleed.  Rubber Band Ligation of Internal Hemorrhoids:  A.  Bulging, bleeding, internal hemorrhoid B.  Rubber band applied at the base of the hemorrhoid C.  About 7 days later, the banded hemorrhoid has fallen off leaving a small scar (arrow)  . Injection and Coagulation can also be used on bleeding hemorrhoids that do not protrude. Both methods are relatively painless and cause the hemorrhoid to shrivel up. . Hemorrhoidectomy - surgery to remove the hemorrhoids - is the most complete method for removal of internal and external hemorrhoids. It is necessary when (1) clots repeatedly form in external hemorrhoids; (2) ligation fails to treat internal hemorrhoids; (3) the protruding hemorrhoid cannot be reduced; or (4) there is persistent bleeding. A hemorrhoidectomy removes excessive tissue that causes the bleeding and protrusion. It is done under anesthesia using sutures, and may, depending upon circumstances, require hospitalization and a period of inactivity. Laser hemorrhoidectomies do not offer any advantage over standard operative techniques. They are also quite expensive, and contrary to popular belief, are no less painful.  Do hemorrhoids lead to cancer? No. There is no relationship between hemorrhoids and cancer. However, the symptoms of hemorrhoids, particularly bleeding, are similar to those of colorectal cancer and other diseases of the digestive system. Therefore, it is important that all symptoms are investigated by a physician specially trained in treating diseases of the colon and rectum and that everyone 50 years or older undergo screening tests for colorectal cancer. Do not rely on over-the-counter medications or other self-treatments. See a colorectal surgeon first so your symptoms can be properly evaluated and effective treatment prescribed.  2012 American Society of Colon & Rectal Surgeons     

## 2012-11-13 NOTE — Progress Notes (Signed)
   Tylin Stradley is a 32 y.o. 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  She is having increasing pain and difficulty with BM's for the past few weeks.  She has tried occasional sitz baths with no help.  She is having occasional drainage and bleeding as well.  Objective: Filed Vitals:   11/13/12 1556  BP: 122/70  Pulse: 84  Resp: 16    General appearance: alert and cooperative GI: normal findings: soft, non-tender   Assessment and Plan: Neylan Koroma is a 32 y.o. F with condyloma.  She now has a thrombosed hemorrhoid that is getting better.  I have given her instructions to help manage her bowel movements.  I will have her start on a stool softener and fiber supplement as well.  We will schedule surgery to address her condyloma and possible thrombosed hemorrhoid.  I will plan on doing a laser ablation of her condyloma. If she still has external hemorrhoid disease we will perform hemorrhoidectomy at the same time. The risks of surgery were signed the patient which mainly include bleeding, recurrence of hemorrhoids and pain.    Vanita Panda, MD [facility], [place] [phone]

## 2012-11-15 ENCOUNTER — Encounter (HOSPITAL_BASED_OUTPATIENT_CLINIC_OR_DEPARTMENT_OTHER): Payer: Self-pay | Admitting: Anesthesiology

## 2012-11-15 ENCOUNTER — Ambulatory Visit (HOSPITAL_BASED_OUTPATIENT_CLINIC_OR_DEPARTMENT_OTHER): Payer: BC Managed Care – PPO | Admitting: Anesthesiology

## 2012-11-15 ENCOUNTER — Encounter (HOSPITAL_BASED_OUTPATIENT_CLINIC_OR_DEPARTMENT_OTHER)
Admission: RE | Disposition: A | Discharge status: Home / Self Care | Payer: Self-pay | Source: Ambulatory Visit | Attending: General Surgery

## 2012-11-15 ENCOUNTER — Ambulatory Visit (HOSPITAL_BASED_OUTPATIENT_CLINIC_OR_DEPARTMENT_OTHER)
Admission: RE | Admit: 2012-11-15 | Discharge: 2012-11-15 | Disposition: A | Discharge status: Home / Self Care | Payer: BC Managed Care – PPO | Source: Ambulatory Visit | Attending: General Surgery | Admitting: General Surgery

## 2012-11-15 DIAGNOSIS — A630 Anogenital (venereal) warts: Secondary | ICD-10-CM | POA: Insufficient documentation

## 2012-11-15 DIAGNOSIS — K645 Perianal venous thrombosis: Secondary | ICD-10-CM | POA: Insufficient documentation

## 2012-11-15 DIAGNOSIS — K602 Anal fissure, unspecified: Secondary | ICD-10-CM | POA: Insufficient documentation

## 2012-11-15 HISTORY — DX: Headache: R51

## 2012-11-15 HISTORY — PX: EVALUATION UNDER ANESTHESIA WITH HEMORRHOIDECTOMY: SHX5624

## 2012-11-15 HISTORY — DX: Anxiety disorder, unspecified: F41.9

## 2012-11-15 SURGERY — EXAM UNDER ANESTHESIA WITH HEMORRHOIDECTOMY
Anesthesia: General | Site: Rectum | Wound class: Clean Contaminated

## 2012-11-15 MED ORDER — MIDAZOLAM HCL 5 MG/5ML IJ SOLN
INTRAMUSCULAR | Status: DC | PRN
  Administered 2012-11-15: 2 mg via INTRAVENOUS

## 2012-11-15 MED ORDER — PROPOFOL 10 MG/ML IV BOLUS
INTRAVENOUS | Status: DC | PRN
  Administered 2012-11-15: 200 mg via INTRAVENOUS

## 2012-11-15 MED ORDER — FIBER PO POWD: 17.0000 g | Freq: Two times a day (BID) | ORAL | Status: DC

## 2012-11-15 MED ORDER — OXYCODONE HCL 5 MG PO TABS: 5.0000 mg | ORAL_TABLET | Freq: Four times a day (QID) | ORAL | Status: DC | PRN

## 2012-11-15 MED ORDER — DOCUSATE SODIUM 100 MG PO CAPS: 100.0000 mg | ORAL_CAPSULE | Freq: Two times a day (BID) | ORAL | Status: DC

## 2012-11-15 MED ORDER — DEXAMETHASONE SODIUM PHOSPHATE 4 MG/ML IJ SOLN
INTRAMUSCULAR | Status: DC | PRN
  Administered 2012-11-15: 10 mg via INTRAVENOUS

## 2012-11-15 MED ORDER — ACETIC ACID 5 % SOLN
Status: DC | PRN
  Administered 2012-11-15: 1 via TOPICAL

## 2012-11-15 MED ORDER — BUPIVACAINE-EPINEPHRINE 0.5% -1:200000 IJ SOLN
INTRAMUSCULAR | Status: DC | PRN
  Administered 2012-11-15: 25 mL

## 2012-11-15 MED ORDER — FENTANYL CITRATE 0.05 MG/ML IJ SOLN
INTRAMUSCULAR | Status: DC | PRN
  Administered 2012-11-15 (×2): 50 ug via INTRAVENOUS

## 2012-11-15 MED ORDER — ONDANSETRON HCL 4 MG/2ML IJ SOLN
INTRAMUSCULAR | Status: DC | PRN
  Administered 2012-11-15: 4 mg via INTRAVENOUS

## 2012-11-15 MED ORDER — LACTATED RINGERS IV SOLN
INTRAVENOUS | Status: DC
  Administered 2012-11-15 (×2): via INTRAVENOUS
  Filled 2012-11-15: qty 1000

## 2012-11-15 MED ORDER — LIDOCAINE HCL (CARDIAC) 20 MG/ML IV SOLN
INTRAVENOUS | Status: DC | PRN
  Administered 2012-11-15: 60 mg via INTRAVENOUS

## 2012-11-15 MED ORDER — MIDAZOLAM HCL 2 MG/2ML IJ SOLN
2.0000 mg | Freq: Once | INTRAMUSCULAR | Status: AC
  Administered 2012-11-15 (×2): 1 mg via INTRAVENOUS
  Filled 2012-11-15: qty 2

## 2012-11-15 MED ORDER — SUCCINYLCHOLINE CHLORIDE 20 MG/ML IJ SOLN
INTRAMUSCULAR | Status: DC | PRN
  Administered 2012-11-15: 100 mg via INTRAVENOUS

## 2012-11-15 MED ORDER — LIDOCAINE 5 % EX OINT
TOPICAL_OINTMENT | CUTANEOUS | Status: DC | PRN
  Administered 2012-11-15: 1

## 2012-11-15 MED ORDER — POLYETHYLENE GLYCOL 3350 17 GM/SCOOP PO POWD: 8.5000 g | Freq: Every day | ORAL | Status: DC | PRN

## 2012-11-15 SURGICAL SUPPLY — 45 items
BLADE HEX COATED 2.75 (ELECTRODE) ×3 IMPLANT
BLADE SURG 15 STRL LF DISP TIS (BLADE) ×2 IMPLANT
BLADE SURG 15 STRL SS (BLADE) ×1
BRIEF STRETCH FOR OB PAD LRG (UNDERPADS AND DIAPERS) ×6 IMPLANT
CANISTER SUCTION 2500CC (MISCELLANEOUS) ×3 IMPLANT
CLOTH BEACON ORANGE TIMEOUT ST (SAFETY) ×3 IMPLANT
COVER MAYO STAND STRL (DRAPES) IMPLANT
COVER TABLE BACK 60X90 (DRAPES) ×3 IMPLANT
DECANTER SPIKE VIAL GLASS SM (MISCELLANEOUS) ×3 IMPLANT
DRAPE LG THREE QUARTER DISP (DRAPES) ×6 IMPLANT
DRAPE PED LAPAROTOMY (DRAPES) ×3 IMPLANT
DRAPE UNDERBUTTOCKS STRL (DRAPE) IMPLANT
DRSG PAD ABDOMINAL 8X10 ST (GAUZE/BANDAGES/DRESSINGS) ×3 IMPLANT
ELECT REM PT RETURN 9FT ADLT (ELECTROSURGICAL) ×3
ELECTRODE REM PT RTRN 9FT ADLT (ELECTROSURGICAL) ×2 IMPLANT
GAUZE SPONGE 4X4 12PLY STRL LF (GAUZE/BANDAGES/DRESSINGS) ×3 IMPLANT
GAUZE SPONGE 4X4 16PLY XRAY LF (GAUZE/BANDAGES/DRESSINGS) IMPLANT
GAUZE VASELINE 3X9 (GAUZE/BANDAGES/DRESSINGS) IMPLANT
GLOVE BIO SURGEON STRL SZ 6.5 (GLOVE) ×6 IMPLANT
GLOVE BIOGEL PI IND STRL 7.0 (GLOVE) ×4 IMPLANT
GLOVE BIOGEL PI INDICATOR 7.0 (GLOVE) ×2
GOWN PREVENTION PLUS LG XLONG (DISPOSABLE) ×3 IMPLANT
GOWN PREVENTION PLUS XLARGE (GOWN DISPOSABLE) ×3 IMPLANT
GOWN STRL REIN XL XLG (GOWN DISPOSABLE) ×3 IMPLANT
LEGGING LITHOTOMY PAIR STRL (DRAPES) IMPLANT
NDL SAFETY ECLIPSE 18X1.5 (NEEDLE) IMPLANT
NEEDLE HYPO 18GX1.5 SHARP (NEEDLE)
NEEDLE HYPO 25X1 1.5 SAFETY (NEEDLE) ×3 IMPLANT
NS IRRIG 500ML POUR BTL (IV SOLUTION) ×3 IMPLANT
PACK BASIN DAY SURGERY FS (CUSTOM PROCEDURE TRAY) ×3 IMPLANT
PENCIL BUTTON HOLSTER BLD 10FT (ELECTRODE) ×3 IMPLANT
SPONGE GAUZE 4X4 12PLY (GAUZE/BANDAGES/DRESSINGS) IMPLANT
SPONGE SURGIFOAM ABS GEL 12-7 (HEMOSTASIS) IMPLANT
SUT CHROMIC 2 0 SH (SUTURE) IMPLANT
SUT CHROMIC 3 0 SH 27 (SUTURE) IMPLANT
SUT MON AB 3-0 SH 27 (SUTURE)
SUT MON AB 3-0 SH27 (SUTURE) IMPLANT
SUT VIC AB 4-0 P-3 18XBRD (SUTURE) IMPLANT
SUT VIC AB 4-0 P3 18 (SUTURE)
SYR CONTROL 10ML LL (SYRINGE) ×3 IMPLANT
TOWEL OR 17X24 6PK STRL BLUE (TOWEL DISPOSABLE) ×3 IMPLANT
TRAY DSU PREP LF (CUSTOM PROCEDURE TRAY) ×3 IMPLANT
TUBE CONNECTING 12X1/4 (SUCTIONS) ×3 IMPLANT
VACUUM HOSE 7/8X10 W/ WAND (MISCELLANEOUS) ×3 IMPLANT
YANKAUER SUCT BULB TIP NO VENT (SUCTIONS) IMPLANT

## 2012-11-15 NOTE — Transfer of Care (Signed)
Immediate Anesthesia Transfer of Care Note  Patient: Judith Jackson  Procedure(s) Performed: Procedure(s) (LRB): EXAM UNDER ANESTHESIA WITH  HEMORRHOIDECTOMY (N/A)  Patient Location: PACU  Anesthesia Type: General  Level of Consciousness: awake, alert  and oriented  Airway & Oxygen Therapy: Patient Spontanous Breathing and Patient connected to face mask oxygen  Post-op Assessment: Report given to PACU RN and Post -op Vital signs reviewed and stable  Post vital signs: Reviewed and stable  Complications: No apparent anesthesia complications

## 2012-11-15 NOTE — H&P (Signed)
  Judith Jackson is a 32 y.o. 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 She is having increasing pain and difficulty with BM's for the past few weeks. She has tried occasional sitz baths with no help. She is having occasional drainage and bleeding as well.   Past Medical History  Diagnosis Date  . Anxiety   . Headache(784.0)     sinus/ migraine headaches   History reviewed. No pertinent past surgical history. Current facility-administered medications:lactated ringers infusion, , Intravenous, Continuous, Romie Levee, MD, Last Rate: 50 mL/hr at 11/15/12 1127 Allergies  Allergen Reactions  . Demerol [Meperidine] Other (See Comments)    halluninations   History   Social History  . Marital Status: Divorced    Spouse Name: N/A    Number of Children: N/A  . Years of Education: N/A   Occupational History  . Not on file.   Social History Main Topics  . Smoking status: Never Smoker   . Smokeless tobacco: Not on file  . Alcohol Use: No     Comment: socially  . Drug Use: No  . Sexual Activity: Not on file   Other Topics Concern  . Not on file   Social History Narrative  . No narrative on file   History reviewed. No pertinent family history. Review of Systems  Constitutional: Negative for fever and chills.  Respiratory: Negative for cough and shortness of breath.   Cardiovascular: Negative for chest pain.  Gastrointestinal: Negative for nausea, vomiting and abdominal pain.  Genitourinary: Negative for dysuria, urgency and frequency.  Skin: Negative for rash.  Neurological: Negative for headaches.    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 She now has a thrombosed hemorrhoid. I have given her instructions  to help manage her bowel movements, although she hasn't started this yet. I will have her start on a stool softener and fiber supplement twice daily. We will schedule surgery to address her condyloma and possible thrombosed hemorrhoid. I will plan on doing a laser ablation of her condyloma. If she still has external hemorrhoid disease we will perform hemorrhoidectomy at the same time. The risks of surgery were signed the patient which mainly include bleeding, recurrence of hemorrhoids and pain.

## 2012-11-15 NOTE — Op Note (Signed)
11/15/2012  1:30 PM  PATIENT:  Judith Jackson  32 y.o. female  Patient Care Team: No Pcp Per Patient as PCP - General (General Practice) Nils Flack, MD as Referring Physician (Dermatology)  PRE-OPERATIVE DIAGNOSIS: h/o anal condyloma, thrombosed external hemorrhoids  POST-OPERATIVE DIAGNOSIS:  Thrombosed external hemorrhoids, posterior and anterior anal fissures  PROCEDURE:  Procedure(s): EXAM UNDER ANESTHESIA WITH  Removal of thrombosed hemorrhoid  SURGEON:  Surgeon(s): Romie Levee, MD  ASSISTANT: none   ANESTHESIA:   local and general  EBL:  Total I/O In: 1000 [I.V.:1000] Out: -   DRAINS: none   SPECIMEN:  No Specimen  DISPOSITION OF SPECIMEN:  N/A  COUNTS:  YES  PLAN OF CARE: Discharge to home after PACU  PATIENT DISPOSITION:  PACU - hemodynamically stable.  INDICATION: this is a 32 year old female who presents today with a thrombosed hemorrhoid and acute anal pain. She has a known history of anal condyloma and it was decided to do an exam under anesthesia with any removal of anal condyloma if necessary as well as remove her thrombosis.  OR FINDINGS: no definitive anal condyloma noted. Thrombosed left lateral external hemorrhoid. Posterior and anterior acute anal fissures.  DESCRIPTION: the patient was identified in the preoperative holding area and taken to the OR where she was laid supine on the operating room table. General anesthesia was smoothly induced. She was then placed prone on the operating room table in jackknife position and properly padded. She was prepped and draped in usual sterile fashion. A surgical timeout was performed indicating the correct patient, procedure, positioning a preoperative antibiotic. SCDs were in place prior to the initiation of anesthesia. After this was completed I began to examine her anal canal. There was a posterior and anterior anal fissure that appeared acute in nature. There was no evidence of sphincter hypertension. A  Hill-Ferguson anoscope was placed into the anal canal and this appeared normal with no signs of mucosal inflammation or hemorrhoid disease.There was one small area that appeared to be a condyloma on a stalk. This was easily trimmed with Metzenbaum scissors. I identified no other condyloma. An acetic acid soaked sponge was placed in the anal canal and allowed to sit for approximately 1 minute. There was some mild acetowhite areas in the anal canal. No distinct lesions were seen though. I decided to forego laser ablation at this time. I turned my attention to the vaginal canal.  Upon external inspection I saw no other signs of condyloma. I then excised the top portion of the skin around the thrombosed hemorrhoid. After this was completed the thrombosis was removed completely. The area was then sutured back together with 3-0 chromic suture. A rectal block was performed using Marcaine with epinephrine. Hemostasis was achieved with direct pressure. Lidocaine ointment and a sterile dressing were applied. The patient was awakened from anesthesia and sent to the post anesthesia care in stable condition. All counts were correct per operating room staff.

## 2012-11-15 NOTE — Anesthesia Postprocedure Evaluation (Signed)
Anesthesia Post Note  Patient: Judith Jackson  Procedure(s) Performed: Procedure(s) (LRB): EXAM UNDER ANESTHESIA WITH  HEMORRHOIDECTOMY (N/A)  Anesthesia type: General  Patient location: PACU  Post pain: Pain level controlled  Post assessment: Post-op Vital signs reviewed  Last Vitals: BP 132/76  Pulse 106  Temp(Src) 37 C (Oral)  Resp 16  Wt 146 lb 8 oz (66.452 kg)  BMI 26.79 kg/m2  SpO2 100%  LMP 10/28/2012  Post vital signs: Reviewed  Level of consciousness: sedated  Complications: No apparent anesthesia complications

## 2012-11-15 NOTE — Anesthesia Preprocedure Evaluation (Addendum)
Anesthesia Evaluation  Patient identified by MRN, date of birth, ID band Patient awake    Reviewed: Allergy & Precautions, H&P , NPO status , Patient's Chart, lab work & pertinent test results  Airway Mallampati: II TM Distance: >3 FB Neck ROM: Full    Dental no notable dental hx.    Pulmonary neg pulmonary ROS,  breath sounds clear to auscultation  Pulmonary exam normal       Cardiovascular negative cardio ROS  Rhythm:Regular Rate:Normal     Neuro/Psych negative neurological ROS  negative psych ROS   GI/Hepatic negative GI ROS, Neg liver ROS,   Endo/Other  negative endocrine ROS  Renal/GU negative Renal ROS  negative genitourinary   Musculoskeletal negative musculoskeletal ROS (+)   Abdominal   Peds negative pediatric ROS (+)  Hematology negative hematology ROS (+)   Anesthesia Other Findings   Reproductive/Obstetrics negative OB ROS                           Anesthesia Physical Anesthesia Plan  ASA: I  Anesthesia Plan: General   Post-op Pain Management:    Induction: Intravenous  Airway Management Planned: Oral ETT  Additional Equipment:   Intra-op Plan:   Post-operative Plan:   Informed Consent: I have reviewed the patients History and Physical, chart, labs and discussed the procedure including the risks, benefits and alternatives for the proposed anesthesia with the patient or authorized representative who has indicated his/her understanding and acceptance.   Dental advisory given  Plan Discussed with: CRNA  Anesthesia Plan Comments:        Anesthesia Quick Evaluation

## 2012-11-18 ENCOUNTER — Encounter (HOSPITAL_BASED_OUTPATIENT_CLINIC_OR_DEPARTMENT_OTHER): Payer: Self-pay | Admitting: General Surgery

## 2012-11-18 ENCOUNTER — Telehealth (INDEPENDENT_AMBULATORY_CARE_PROVIDER_SITE_OTHER): Payer: Self-pay

## 2012-11-18 NOTE — Telephone Encounter (Signed)
Pt calling in today 3 days s/p thrombosed hemorrhoidectomy. Pt states that the lidocaine stings when she places it on the area.  Pt also concerned that she has a new hemorrhoid / condyloma.  After she explained what she was feeling to me, and reading the op notes, it sounds more like a skin tag from surgery.  Advised pt that the lidocaine may sting for a while, especially since the area if full of nerve endings and she did just have surgery, but that it is meant to numb the area.  Pt states that she is taking sitz baths but they are uncomfortable.  Pt also stated that it hurts to have a bowel movement and that she has to drink "clear liquids" just in order to have one.  Advised pt that she should increase her water intake to help loosen her stools.  Pt verbalized understanding.

## 2012-11-19 ENCOUNTER — Telehealth (INDEPENDENT_AMBULATORY_CARE_PROVIDER_SITE_OTHER): Payer: Self-pay

## 2012-11-19 ENCOUNTER — Encounter (INDEPENDENT_AMBULATORY_CARE_PROVIDER_SITE_OTHER): Payer: Self-pay

## 2012-11-19 NOTE — Telephone Encounter (Signed)
Patient states she needs a return to work note,she asking for letter not to have MD name on it and asking for it to mail to her home. Letter mailed

## 2012-11-28 ENCOUNTER — Encounter (INDEPENDENT_AMBULATORY_CARE_PROVIDER_SITE_OTHER): Payer: Self-pay | Admitting: General Surgery

## 2012-11-28 ENCOUNTER — Ambulatory Visit (INDEPENDENT_AMBULATORY_CARE_PROVIDER_SITE_OTHER): Payer: BC Managed Care – PPO | Admitting: General Surgery

## 2012-11-28 ENCOUNTER — Encounter (INDEPENDENT_AMBULATORY_CARE_PROVIDER_SITE_OTHER): Payer: BC Managed Care – PPO | Admitting: General Surgery

## 2012-11-28 ENCOUNTER — Encounter (INDEPENDENT_AMBULATORY_CARE_PROVIDER_SITE_OTHER): Payer: Self-pay

## 2012-11-28 VITALS — BP 110/62 | HR 84 | Resp 20 | Ht 62.0 in | Wt 151.0 lb

## 2012-11-28 DIAGNOSIS — Z9889 Other specified postprocedural states: Secondary | ICD-10-CM

## 2012-11-28 NOTE — Patient Instructions (Signed)
Continue fiber and stool softeners.  Use Miralax if unable to have a bowel movement.  Call the office if your symptoms are not better in 4 weeks.

## 2012-11-28 NOTE — Progress Notes (Signed)
Judith Jackson is a 32 y.o. female who is status post a hemorrhoidectomy on 10/3.  She is feeling better.  She still has some itching and burning and pain with BMs  Objective: Filed Vitals:   11/28/12 1627  BP: 110/62  Pulse: 84  Resp: 20    General appearance: alert and cooperative GI: normal findings: soft, non-tender  Incision: healing well, still with a small area of nonhealing skin at the posterior anal canal. No residual hemorrhoid disease or condyloma noted.   Assessment: s/p  Patient Active Problem List   Diagnosis Date Noted  . Anal condyloma 07/12/2012    Plan: Continue bowel regimen. Call the office if symptoms have not resolved within the next 4 weeks.    Vanita Panda, MD [facility], [place] [phone]   11/28/2012 4:46 PM

## 2012-12-12 ENCOUNTER — Emergency Department (INDEPENDENT_AMBULATORY_CARE_PROVIDER_SITE_OTHER): Payer: BC Managed Care – PPO

## 2012-12-12 ENCOUNTER — Emergency Department (HOSPITAL_COMMUNITY)
Admission: EM | Admit: 2012-12-12 | Discharge: 2012-12-12 | Disposition: A | Discharge status: Home / Self Care | Payer: BC Managed Care – PPO | Source: Home / Self Care | Attending: Family Medicine | Admitting: Family Medicine

## 2012-12-12 ENCOUNTER — Encounter (HOSPITAL_COMMUNITY): Payer: Self-pay | Admitting: Emergency Medicine

## 2012-12-12 DIAGNOSIS — S20219A Contusion of unspecified front wall of thorax, initial encounter: Secondary | ICD-10-CM

## 2012-12-12 DIAGNOSIS — W1809XA Striking against other object with subsequent fall, initial encounter: Secondary | ICD-10-CM

## 2012-12-12 DIAGNOSIS — S20211A Contusion of right front wall of thorax, initial encounter: Secondary | ICD-10-CM

## 2012-12-12 DIAGNOSIS — M62838 Other muscle spasm: Secondary | ICD-10-CM

## 2012-12-12 MED ORDER — MELOXICAM 15 MG PO TABS: 15.0000 mg | ORAL_TABLET | Freq: Every day | ORAL | Status: DC | PRN

## 2012-12-12 MED ORDER — KETOROLAC TROMETHAMINE 60 MG/2ML IM SOLN
60.0000 mg | Freq: Once | INTRAMUSCULAR | Status: AC
  Administered 2012-12-12: 60 mg via INTRAMUSCULAR

## 2012-12-12 MED ORDER — KETOROLAC TROMETHAMINE 60 MG/2ML IM SOLN
INTRAMUSCULAR | Status: AC
  Filled 2012-12-12: qty 2

## 2012-12-12 MED ORDER — OXYCODONE-ACETAMINOPHEN 5-325 MG PO TABS: 1.0000 | ORAL_TABLET | Freq: Four times a day (QID) | ORAL | Status: DC | PRN

## 2012-12-12 MED ORDER — CYCLOBENZAPRINE HCL 10 MG PO TABS: 10.0000 mg | ORAL_TABLET | Freq: Every evening | ORAL | Status: DC | PRN

## 2012-12-12 NOTE — ED Notes (Addendum)
Pt reports she tripped and fell Monday night onto carpet flooring and hit a wooden table Right flank/rib cage hurts when she breaths in and pain increases w/activity Also has a bruised right arm/elbow Denies head inj/LOC She is alert w/no signs of acute distress... Slow steady gait.

## 2012-12-12 NOTE — ED Provider Notes (Signed)
Judith Jackson is a 32 y.o. female who presents to Urgent Care today for falls and right-sided pain. Patient tripped and fell Monday landing on a wooden table. She hit her right arm and right rib since and flank on the table. She notes severe pain in her right flank and right lateral and posterior ribs. The pain is worse with standing and deep inspiration. Additionally she notes bruising and pain at the distal humerus on the right and into the elbow. This is worse with palpation. She denies any difficulty with elbow motion. She does note pain with shoulder motion but no significant shoulder pain with shoulder motion. She denies any significant central neck pain radiating pain weakness or numbness. She denies any bowel bladder dysfunction. She has been taking ibuprofen which has not helped much. She denies any loss of consciousness.   Past Medical History  Diagnosis Date  . Anxiety   . Headache(784.0)     sinus/ migraine headaches   History  Substance Use Topics  . Smoking status: Never Smoker   . Smokeless tobacco: Not on file  . Alcohol Use: No     Comment: socially   ROS as above Medications reviewed. No current facility-administered medications for this encounter.   Current Outpatient Prescriptions  Medication Sig Dispense Refill  . cyclobenzaprine (FLEXERIL) 10 MG tablet Take 1 tablet (10 mg total) by mouth at bedtime as needed for muscle spasms.  20 tablet  0  . docusate sodium (COLACE) 100 MG capsule Take 1 capsule (100 mg total) by mouth 2 (two) times daily.    0  . Fiber POWD Take 17 g by mouth 2 (two) times daily.    0  . ibuprofen (ADVIL,MOTRIN) 200 MG tablet Take 600 mg by mouth every 6 (six) hours as needed for pain.      Marland Kitchen lidocaine (XYLOCAINE) 5 % ointment Apply topically as needed.  35.44 g  0  . meloxicam (MOBIC) 15 MG tablet Take 1 tablet (15 mg total) by mouth daily as needed for pain.  30 tablet  0  . ondansetron (ZOFRAN) 4 MG tablet Take 1 tablet (4 mg total) by mouth  every 6 (six) hours.  8 tablet  0  . oxyCODONE-acetaminophen (PERCOCET/ROXICET) 5-325 MG per tablet Take 1 tablet by mouth every 6 (six) hours as needed for pain.  20 tablet  0  . polyethylene glycol powder (MIRALAX) powder Take 8.5-34 g by mouth daily as needed (Constipation). To correct constipation.  Adjust dose over 1-2 months.  Goal = ~1 bowel movement / day  255 g  0    Exam:  BP 126/85  Pulse 61  Temp(Src) 98.4 F (36.9 C) (Oral)  Resp 18  SpO2 100%  LMP 12/12/2012 Gen: Well NAD HEENT: EOMI,  MMM Lungs: CTABL Nl WOB Heart: RRR no MRG Abd: NABS, NT, ND Exts: Non edematous BL  LE, warm and well perfused.   EXAM:  RIGHT ELBOW - COMPLETE 3+ VIEW  COMPARISON: None.  FINDINGS:  There is no evidence of fracture, dislocation, or joint effusion.  There is no evidence of arthropathy or other focal bone abnormality.  Soft tissues are unremarkable.  IMPRESSION:  No acute abnormality noted.  EXAM:  RIGHT RIBS AND CHEST - 3+ VIEW  COMPARISON: None.  FINDINGS:  The heart and pulmonary vascularity are within normal limits. The  lungs are clear bilaterally. No pneumothorax is seen. No acute  displaced or deforming rib fracture is noted. No other focal  abnormality is seen.  IMPRESSION:  No acute abnormality noted.   Assessment and Plan: 32 y.o. female with contusion of ribs.  Plan to treat with Flexeril, meloxicam, oxycodone. Followup if not improving.  Discussed warning signs or symptoms. Please see discharge instructions. Patient expresses understanding.      Rodolph Bong, MD 12/15/12 [phone]

## 2012-12-19 ENCOUNTER — Other Ambulatory Visit: Payer: Self-pay

## 2012-12-27 ENCOUNTER — Other Ambulatory Visit: Payer: Self-pay

## 2013-01-13 ENCOUNTER — Telehealth (INDEPENDENT_AMBULATORY_CARE_PROVIDER_SITE_OTHER): Payer: Self-pay

## 2013-01-13 NOTE — Telephone Encounter (Signed)
Message copied by Ivory Broad on Mon Jan 13, 2013  3:27 PM ------      Message from: Rise Paganini      Created: Mon Jan 13, 2013 12:23 PM      Regarding: Glenna Durand: [phone]       Patient was last seen in the office on 11/28/12-She stated that Dr. Maisie Fus told her to call the office if she continued to experience problems. Patient stated that she is still having problems going to the bathroom, feels like a razor when she does go and there's a good amount of blood. I searched for an appointment, but the first available is 01/21/13 and she will need an afternoon appointment. Please call to discuss. Thank you. ------

## 2013-01-13 NOTE — Telephone Encounter (Signed)
I called and scheduled the pt to come in next Monday at 2:40.  That's the soonest afternoon I could find.

## 2013-01-20 ENCOUNTER — Ambulatory Visit (INDEPENDENT_AMBULATORY_CARE_PROVIDER_SITE_OTHER): Payer: BC Managed Care – PPO | Admitting: General Surgery

## 2013-01-20 ENCOUNTER — Encounter (INDEPENDENT_AMBULATORY_CARE_PROVIDER_SITE_OTHER): Payer: Self-pay | Admitting: General Surgery

## 2013-01-20 VITALS — BP 112/70 | HR 72 | Temp 98.7°F | Resp 14 | Ht 62.0 in | Wt 151.2 lb

## 2013-01-20 DIAGNOSIS — K625 Hemorrhage of anus and rectum: Secondary | ICD-10-CM

## 2013-01-20 NOTE — Patient Instructions (Signed)
Stop the stool softeners and continue taking your fiber supplement.  Return to the office if your symptoms don't get better over the next 8 weeks.

## 2013-01-20 NOTE — Progress Notes (Signed)
Judith Jackson is a 32 y.o. female who is here for a follow up visit regarding her anal bleeding.  She states episodes of diarrhea over the past few weeks that caused some rectal bleeding.  She is having some mild anal pain as well.    Objective: Filed Vitals:   01/20/13 1427  BP: 112/70  Pulse: 72  Temp: 98.7 F (37.1 C)  Resp: 14    General appearance: alert and cooperative Anal: mild posterior fissure, no sphincter hypertension, no recurrent condyloma   Assessment and Plan: I believe she is having bleeding due to a small posterior fissure in the area of her hemorrhoid surgery.  I will have her hold the stool softeners and cont the fiber supplements to help with making her BM's more regular.  She will call the office if the problem persists.      Vanita Panda, MD [facility], [place] [phone]

## 2013-02-28 ENCOUNTER — Telehealth (INDEPENDENT_AMBULATORY_CARE_PROVIDER_SITE_OTHER): Payer: Self-pay

## 2013-02-28 NOTE — Telephone Encounter (Signed)
Pt of Dr. Maisie Fushomas' with a history of anal fissure and hemorrhoids.  She is calling today because she has just had an extremely painful BM and had a lot of bleeding.  She has not had an episode in several months, but states she is having a lot of pain.  She is out of town, but will return on Sunday.  I suggested she go directly to the local ED but she declined.  I recommended she call our office first thing on Monday morning to be scheduled on urgent office.  Pt agreed.

## 2013-03-03 ENCOUNTER — Encounter (INDEPENDENT_AMBULATORY_CARE_PROVIDER_SITE_OTHER): Payer: Self-pay | Admitting: General Surgery

## 2013-03-03 ENCOUNTER — Ambulatory Visit (INDEPENDENT_AMBULATORY_CARE_PROVIDER_SITE_OTHER): Payer: BC Managed Care – PPO | Admitting: General Surgery

## 2013-03-03 VITALS — BP 110/74 | HR 72 | Temp 97.8°F | Resp 14 | Ht 62.0 in | Wt 153.8 lb

## 2013-03-03 DIAGNOSIS — K602 Anal fissure, unspecified: Secondary | ICD-10-CM | POA: Insufficient documentation

## 2013-03-03 MED ORDER — AMBULATORY NON FORMULARY MEDICATION: 1.0000 [drp] | Freq: Two times a day (BID) | Status: DC

## 2013-03-03 NOTE — Progress Notes (Signed)
Subjective:     Patient ID: Judith Jackson, female   DOB: August 04, 1980, 33 y.o.   MRN: [id number]  HPI 33 year old Caucasian female status post exam under anesthesia and excisional external hemorrhoidectomy on October 3 by Dr. Maisie Fushomas. She was also diagnosed with anal fissures. She states that she started having problems again last week. She reports severe pain when having a bowel movement. She states that it feels like she is passing a razor. She is sitting on the commode for more than 10 minutes at a time. She is drinking tons of water. She is not doing any sitz bathes currently. She stopped taking Metamucil because it gave her cramps. She is also having bright red blood in the toilet. She is averaging about one bowel movement per week.  PMHx, PSHx, SOCHx, FAMHx, ALL reviewed and unchanged  Review of Systems 8 opint ROS performed and negative excpet for HPI    Objective:   Physical Exam  Constitutional: She is oriented to person, place, and time. She appears well-developed and well-nourished. No distress.  HENT:  Head: Normocephalic and atraumatic.  Eyes: Conjunctivae are normal. No scleral icterus.  Pulmonary/Chest: Effort normal.  Abdominal: Soft. She exhibits no distension. There is no tenderness.  Neurological: She is alert and oriented to person, place, and time.  Skin: Skin is warm and dry. She is not diaphoretic.   BP 110/74  Pulse 72  Temp(Src) 97.8 F (36.6 C) (Temporal)  Resp 14  Ht 5\' 2"  (1.575 m)  Wt 153 lb 12.8 oz (69.763 kg)  BMI 28.12 kg/m2 Alert, no apparent distress Rectal- visuall inspection only. No external hemorrhoids. Positive posterior midline anal fissure     Assessment:     Posterior anal fissure     Plan:     We discussed the etiology of anal fissures. The patient was given educational material as well as diagrams. We discussed nonoperative and operative management of anal fissures.  We discussed nonoperative management including correcting underlying  bowel habits such as constipation, avoiding bathroom reading, avoiding straining with defecation. We also discussed the use of topical ointments such as nifedipine ointment. We also discussed the use of Botox injection.  My recommendation was to start with non-operative management first.  The patient has elected Start with medical treatment. She was instructed to do nifedipine 2% ointment twice a day. She was also instructed to do sitz baths 3-4 times a day and after a bowel movement. I instructed her on fiber supplementation and encouraged her to start with a low dose and gradually titrate up to 25 g a day in order to avoid bloating and cramping. In the interim I advised to take MiraLAX on a daily basis. We discussed the importance of not applying any ointment that has hydrocortisone ointment in it. We discussed that she needs to do all these things for at least 6 weeks even if she starts to feel better  Followup with Dr. Audley Hosehomas  Trystin Terhune M. Andrey CampanileWilson, MD, FACS General, Bariatric, & Minimally Invasive Surgery [facility] [place]

## 2013-03-03 NOTE — Patient Instructions (Addendum)
Apply nifedipine ointemnt twice a day Soak in warm water tub soaks 3-4 times a day and after bowel movement Take Miralax daily until fiber starts working  Anal Fissure, Adult An anal fissure is a small tear or crack in the skin around the anus. Bleeding from a fissure usually stops on its own within a few minutes. However, bleeding will often reoccur with each bowel movement until the crack heals.  CAUSES   Passing large, hard stools.  Frequent diarrheal stools.  Constipation.  Inflammatory bowel disease (Crohn's disease or ulcerative colitis).  Infections.  Anal sex. SYMPTOMS   Small amounts of blood seen on your stools, on toilet paper, or in the toilet after a bowel movement.  Rectal bleeding.  Painful bowel movements.  Itching or irritation around the anus. DIAGNOSIS Your caregiver will examine the anal area. An anal fissure can usually be seen with careful inspection. A rectal exam may be performed and a short tube (anoscope) may be used to examine the anal canal. TREATMENT   You may be instructed to take fiber supplements. These supplements can soften your stool to help make bowel movements easier.  Sitz baths may be recommended to help heal the tear. Do not use soap in the sitz baths.  A medicated cream or ointment may be prescribed to lessen discomfort. HOME CARE INSTRUCTIONS   Maintain a diet high in fruits, whole grains, and vegetables. Avoid constipating foods like bananas and dairy products.  Take sitz baths as directed by your caregiver.  Drink enough fluids to keep your urine clear or pale yellow.  Only take over-the-counter or prescription medicines for pain, discomfort, or fever as directed by your caregiver. Do not take aspirin as this may increase bleeding.  Do not use ointments containing numbing medications (anesthetics) or hydrocortisone. They could slow healing. SEEK MEDICAL CARE IF:    You have a fever.  You have diarrhea mixed with  blood.  You have pain.  Your problem is getting worse rather than better. MAKE SURE YOU:   Understand these instructions.  Will watch your condition.  Will get help right away if you are not doing well or get worse. Document Released: 01/30/2005 Document Revised: 04/24/2011 Document Reviewed: 07/17/2010 [facility] Patient Information 2014 [place], [place].  GETTING TO GOOD BOWEL HEALTH. Irregular bowel habits such as constipation and diarrhea can lead to many problems over time.  Having one soft bowel movement a day is the most important way to prevent further problems.  The anorectal canal is designed to handle stretching and feces to safely manage our ability to get rid of solid waste (feces, poop, stool) out of our body.  BUT, hard constipated stools can act like ripping concrete bricks and diarrhea can be a burning fire to this very sensitive area of our body, causing inflamed hemorrhoids, anal fissures, increasing risk is perirectal abscesses, abdominal pain/bloating, an making irritable bowel worse.     The goal: ONE SOFT BOWEL MOVEMENT A DAY!  To have soft, regular bowel movements:    Drink at least 8 tall glasses of water a day.     Take plenty of fiber.  Fiber is the undigested part of plant food that passes into the colon, acting s "natures broom" to encourage bowel motility and movement.  Fiber can absorb and hold large amounts of water. This results in a larger, bulkier stool, which is soft and easier to pass. Work gradually over several weeks up to 6 servings a day of fiber (25g a  day even more if needed) in the form of: o Vegetables -- Root (potatoes, carrots, turnips), leafy green (lettuce, salad greens, celery, spinach), or cooked high residue (cabbage, broccoli, etc) o Fruit -- Fresh (unpeeled skin & pulp), Dried (prunes, apricots, cherries, etc ),  or stewed ( applesauce)  o Whole grain breads, pasta, etc (whole wheat)  o Bran cereals    Bulking Agents -- This type of  water-retaining fiber generally is easily obtained each day by one of the following:  o Psyllium bran -- The psyllium plant is remarkable because its ground seeds can retain so much water. This product is available as Metamucil, Konsyl, Effersyllium, Per Diem Fiber, or the less expensive generic preparation in drug and health food stores. Although labeled a laxative, it really is not a laxative.  o Methylcellulose -- This is another fiber derived from wood which also retains water. It is available as Citrucel. o Benefiber o Polyethylene Glycol - and "artificial" fiber commonly called Miralax or Glycolax.  It is helpful for people with gassy or bloated feelings with regular fiber o Flax Seed - a less gassy fiber than psyllium   No reading or other relaxing activity while on the toilet. If bowel movements take longer than 5 minutes, you are too constipated   AVOID CONSTIPATION.  High fiber and water intake usually takes care of this.  Sometimes a laxative is needed to stimulate more frequent bowel movements, but    Laxatives are not a good long-term solution as it can wear the colon out. o Osmotics (Milk of Magnesia, Fleets phosphosoda, Magnesium citrate, MiraLax, GoLytely) are safer than  o Stimulants (Senokot, Castor Oil, Dulcolax, Ex Lax)    o Do not take laxatives for more than 7days in a row.    IF SEVERELY CONSTIPATED, try a Bowel Retraining Program: o Do not use laxatives.  o Eat a diet high in roughage, such as bran cereals and leafy vegetables.  o Drink six (6) ounces of prune or apricot juice each morning.  o Eat two (2) large servings of stewed fruit each day.  o Take one (1) heaping tablespoon of a psyllium-based bulking agent twice a day. Use sugar-free sweetener when possible to avoid excessive calories.  o Eat a normal breakfast.  o Set aside 15 minutes after breakfast to sit on the toilet, but do not strain to have a bowel movement.  o If you do not have a bowel movement by the  third day, use an enema and repeat the above steps.    Controlling diarrhea o Switch to liquids and simpler foods for a few days to avoid stressing your intestines further. o Avoid dairy products (especially milk & ice cream) for a short time.  The intestines often can lose the ability to digest lactose when stressed. o Avoid foods that cause gassiness or bloating.  Typical foods include beans and other legumes, cabbage, broccoli, and dairy foods.  Every person has some sensitivity to other foods, so listen to our body and avoid those foods that trigger problems for you. o Adding fiber (Citrucel, Metamucil, psyllium, Miralax) gradually can help thicken stools by absorbing excess fluid and retrain the intestines to act more normally.  Slowly increase the dose over a few weeks.  Too much fiber too soon can backfire and cause cramping & bloating. o Probiotics (such as active yogurt, Align, etc) may help repopulate the intestines and colon with normal bacteria and calm down a sensitive digestive tract.  Most studies show  it to be of mild help, though, and such products can be costly. o Medicines:   Bismuth subsalicylate (ex. Kayopectate, Pepto Bismol) every 30 minutes for up to 6 doses can help control diarrhea.  Avoid if pregnant.   Loperamide (Immodium) can slow down diarrhea.  Start with two tablets (4mg  total) first and then try one tablet every 6 hours.  Avoid if you are having fevers or severe pain.  If you are not better or start feeling worse, stop all medicines and call your doctor for advice o Call your doctor if you are getting worse or not better.  Sometimes further testing (cultures, endoscopy, X-ray studies, bloodwork, etc) may be needed to help diagnose and treat the cause of the diarrhea.  Fiber Chart  You should 25-30g of fiber per day and drinking 8 glasses of water to help your bowels move regularly.  In the chart below you can look up how much fiber you are getting in an average day.   If you are not getting enough fiber, you should add a fiber supplement to your diet.  Examples of this include Metamucil, FiberCon and Citrucel.  These can be purchased at your local grocery store or pharmacy.      [email or web address]

## 2013-04-07 ENCOUNTER — Encounter (INDEPENDENT_AMBULATORY_CARE_PROVIDER_SITE_OTHER): Payer: Self-pay | Admitting: General Surgery

## 2013-05-02 ENCOUNTER — Encounter (HOSPITAL_BASED_OUTPATIENT_CLINIC_OR_DEPARTMENT_OTHER): Payer: Self-pay | Admitting: *Deleted

## 2013-05-06 ENCOUNTER — Encounter (HOSPITAL_BASED_OUTPATIENT_CLINIC_OR_DEPARTMENT_OTHER): Payer: Self-pay | Admitting: *Deleted

## 2013-05-06 NOTE — Progress Notes (Addendum)
NPO AFTER MN WITH EXCEPTION CLEAR LIQUIDS UNTIL 0730 (NO CREAM/ MILK PRODUCTS).  ARRIVE AT 1200. NEEDS HG AND URINE PREG.  PRE-OP ORDERS PENDING.

## 2013-05-14 ENCOUNTER — Encounter (HOSPITAL_BASED_OUTPATIENT_CLINIC_OR_DEPARTMENT_OTHER)
Admission: RE | Disposition: A | Discharge status: Home / Self Care | Payer: Self-pay | Source: Ambulatory Visit | Attending: Obstetrics and Gynecology

## 2013-05-14 ENCOUNTER — Ambulatory Visit (HOSPITAL_COMMUNITY): Payer: BC Managed Care – PPO

## 2013-05-14 ENCOUNTER — Encounter (HOSPITAL_BASED_OUTPATIENT_CLINIC_OR_DEPARTMENT_OTHER): Payer: Self-pay

## 2013-05-14 ENCOUNTER — Ambulatory Visit (HOSPITAL_BASED_OUTPATIENT_CLINIC_OR_DEPARTMENT_OTHER)
Admission: RE | Admit: 2013-05-14 | Discharge: 2013-05-14 | Disposition: A | Discharge status: Home / Self Care | Payer: BC Managed Care – PPO | Source: Ambulatory Visit | Attending: Obstetrics and Gynecology | Admitting: Obstetrics and Gynecology

## 2013-05-14 ENCOUNTER — Ambulatory Visit (HOSPITAL_BASED_OUTPATIENT_CLINIC_OR_DEPARTMENT_OTHER): Payer: BC Managed Care – PPO | Admitting: Anesthesiology

## 2013-05-14 ENCOUNTER — Encounter (HOSPITAL_BASED_OUTPATIENT_CLINIC_OR_DEPARTMENT_OTHER): Payer: BC Managed Care – PPO | Admitting: Anesthesiology

## 2013-05-14 DIAGNOSIS — N979 Female infertility, unspecified: Secondary | ICD-10-CM | POA: Insufficient documentation

## 2013-05-14 DIAGNOSIS — N840 Polyp of corpus uteri: Secondary | ICD-10-CM | POA: Insufficient documentation

## 2013-05-14 HISTORY — PX: HYSTEROSCOPY: SHX211

## 2013-05-14 HISTORY — DX: Other chronic pain: G89.29

## 2013-05-14 HISTORY — DX: Polyp of corpus uteri: N84.0

## 2013-05-14 HISTORY — DX: Other specified postprocedural states: Z98.890

## 2013-05-14 HISTORY — DX: Presence of spectacles and contact lenses: Z97.3

## 2013-05-14 HISTORY — DX: Other specified diseases of anus and rectum: K62.89

## 2013-05-14 HISTORY — DX: Nausea with vomiting, unspecified: R11.2

## 2013-05-14 LAB — POCT PREGNANCY, URINE: Preg Test, Ur: NEGATIVE

## 2013-05-14 LAB — POCT HEMOGLOBIN-HEMACUE: HEMOGLOBIN: 12.5 g/dL (ref 12.0–15.0)

## 2013-05-14 SURGERY — HYSTEROSCOPY
Anesthesia: General | Site: Vagina

## 2013-05-14 MED ORDER — MIDAZOLAM HCL 5 MG/5ML IJ SOLN
INTRAMUSCULAR | Status: DC | PRN
  Administered 2013-05-14: 2 mg via INTRAVENOUS

## 2013-05-14 MED ORDER — LACTATED RINGERS IV SOLN
INTRAVENOUS | Status: DC | PRN
  Administered 2013-05-14: 14:00:00 via INTRAVENOUS

## 2013-05-14 MED ORDER — CEFAZOLIN SODIUM-DEXTROSE 2-3 GM-% IV SOLR
INTRAVENOUS | Status: DC | PRN
  Administered 2013-05-14: 2 g via INTRAVENOUS

## 2013-05-14 MED ORDER — MIDAZOLAM HCL 2 MG/2ML IJ SOLN
INTRAMUSCULAR | Status: AC
  Filled 2013-05-14: qty 2

## 2013-05-14 MED ORDER — ACETAMINOPHEN 10 MG/ML IV SOLN
INTRAVENOUS | Status: DC | PRN
  Administered 2013-05-14: 1000 mg via INTRAVENOUS

## 2013-05-14 MED ORDER — FENTANYL CITRATE 0.05 MG/ML IJ SOLN
INTRAMUSCULAR | Status: DC | PRN
  Administered 2013-05-14: 25 ug via INTRAVENOUS
  Administered 2013-05-14: 50 ug via INTRAVENOUS
  Administered 2013-05-14: 25 ug via INTRAVENOUS
  Administered 2013-05-14: 50 ug via INTRAVENOUS
  Administered 2013-05-14 (×2): 25 ug via INTRAVENOUS

## 2013-05-14 MED ORDER — FENTANYL CITRATE 0.05 MG/ML IJ SOLN
25.0000 ug | INTRAMUSCULAR | Status: DC | PRN
  Filled 2013-05-14: qty 1

## 2013-05-14 MED ORDER — LIDOCAINE HCL (CARDIAC) 20 MG/ML IV SOLN
INTRAVENOUS | Status: DC | PRN
  Administered 2013-05-14: 100 mg via INTRAVENOUS

## 2013-05-14 MED ORDER — SODIUM CHLORIDE 0.9 % IR SOLN
Status: DC | PRN
  Administered 2013-05-14: 3000 mL

## 2013-05-14 MED ORDER — IOHEXOL 350 MG/ML SOLN
INTRAVENOUS | Status: DC | PRN
  Administered 2013-05-14: 5 mL

## 2013-05-14 MED ORDER — LACTATED RINGERS IV SOLN
INTRAVENOUS | Status: DC
Start: 2013-05-14 — End: 2013-05-14
  Administered 2013-05-14 (×3): via INTRAVENOUS
  Filled 2013-05-14: qty 1000

## 2013-05-14 MED ORDER — METOCLOPRAMIDE HCL 5 MG/ML IJ SOLN
INTRAMUSCULAR | Status: DC | PRN
  Administered 2013-05-14: 10 mg via INTRAVENOUS

## 2013-05-14 MED ORDER — DEXAMETHASONE SODIUM PHOSPHATE 4 MG/ML IJ SOLN
INTRAMUSCULAR | Status: DC | PRN
  Administered 2013-05-14: 10 mg via INTRAVENOUS

## 2013-05-14 MED ORDER — SUCCINYLCHOLINE CHLORIDE 20 MG/ML IJ SOLN
INTRAMUSCULAR | Status: DC | PRN
  Administered 2013-05-14: 100 mg via INTRAVENOUS

## 2013-05-14 MED ORDER — PROMETHAZINE HCL 25 MG/ML IJ SOLN
6.2500 mg | INTRAMUSCULAR | Status: DC | PRN
  Filled 2013-05-14: qty 1

## 2013-05-14 MED ORDER — PROPOFOL 10 MG/ML IV BOLUS
INTRAVENOUS | Status: DC | PRN
  Administered 2013-05-14: 200 mg via INTRAVENOUS

## 2013-05-14 MED ORDER — KETOROLAC TROMETHAMINE 30 MG/ML IJ SOLN
INTRAMUSCULAR | Status: DC | PRN
  Administered 2013-05-14: 30 mg via INTRAVENOUS

## 2013-05-14 MED ORDER — ONDANSETRON HCL 4 MG/2ML IJ SOLN
INTRAMUSCULAR | Status: DC | PRN
  Administered 2013-05-14: 4 mg via INTRAVENOUS

## 2013-05-14 MED ORDER — FENTANYL CITRATE 0.05 MG/ML IJ SOLN
INTRAMUSCULAR | Status: AC
  Filled 2013-05-14: qty 6

## 2013-05-14 MED ORDER — VASOPRESSIN 20 UNIT/ML IJ SOLN
INTRAVENOUS | Status: DC | PRN
  Administered 2013-05-14: 17:00:00 via INTRAMUSCULAR

## 2013-05-14 SURGICAL SUPPLY — 48 items
CANISTER SUCTION 2500CC (MISCELLANEOUS) ×3 IMPLANT
CANNULA CURETTE W/SYR 6 (CANNULA) IMPLANT
CANNULA CURETTE W/SYR 6MM (CANNULA)
CATH ROBINSON RED A/P 16FR (CATHETERS) ×3 IMPLANT
CATH TENACATH HSG (CATHETERS) ×3 IMPLANT
CORD ACTIVE DISPOSABLE (ELECTRODE)
CORD ELECTRO ACTIVE DISP (ELECTRODE) IMPLANT
COVER TABLE BACK 60X90 (DRAPES) ×3 IMPLANT
DRAPE C-ARM 42X72 X-RAY (DRAPES) ×3 IMPLANT
DRAPE CAMERA CLOSED 9X96 (DRAPES) ×6 IMPLANT
DRAPE LG THREE QUARTER DISP (DRAPES) ×6 IMPLANT
DRESSING TELFA 8X3 (GAUZE/BANDAGES/DRESSINGS) ×3 IMPLANT
ELECT LOOP GYNE PRO 24FR (CUTTING LOOP)
ELECT REM PT RETURN 9FT ADLT (ELECTROSURGICAL)
ELECT VAPORTRODE GRVD BAR (ELECTRODE) IMPLANT
ELECTRODE LOOP GYNE PRO 24FR (CUTTING LOOP) IMPLANT
ELECTRODE REM PT RTRN 9FT ADLT (ELECTROSURGICAL) IMPLANT
ELECTRODE ROLLER VERSAPOINT (ELECTRODE) IMPLANT
ELECTRODE RT ANGLE VERSAPOINT (CUTTING LOOP) IMPLANT
GLOVE BIO SURGEON STRL SZ8 (GLOVE) ×3 IMPLANT
GLOVE BIOGEL M 6.5 STRL (GLOVE) ×6 IMPLANT
GLOVE BIOGEL PI IND STRL 6.5 (GLOVE) ×1 IMPLANT
GLOVE BIOGEL PI IND STRL 7.5 (GLOVE) ×1 IMPLANT
GLOVE BIOGEL PI IND STRL 8.5 (GLOVE) ×1 IMPLANT
GLOVE BIOGEL PI INDICATOR 6.5 (GLOVE) ×2
GLOVE BIOGEL PI INDICATOR 7.5 (GLOVE) ×2
GLOVE BIOGEL PI INDICATOR 8.5 (GLOVE) ×2
GOWN STRL REIN XL XLG (GOWN DISPOSABLE) ×6 IMPLANT
GOWN STRL REUS W/TWL LRG LVL3 (GOWN DISPOSABLE) ×3 IMPLANT
GOWN STRL REUS W/TWL XL LVL3 (GOWN DISPOSABLE) ×3 IMPLANT
LEGGING LITHOTOMY PAIR STRL (DRAPES) ×3 IMPLANT
LOOP ANGLED CUTTING 22FR (CUTTING LOOP) IMPLANT
NEEDLE SPNL 22GX3.5 QUINCKE BK (NEEDLE) ×3 IMPLANT
PACK BASIN DAY SURGERY FS (CUSTOM PROCEDURE TRAY) ×3 IMPLANT
PAD OB MATERNITY 4.3X12.25 (PERSONAL CARE ITEMS) ×3 IMPLANT
SET IRRIG Y TYPE TUR BLADDER L (SET/KITS/TRAYS/PACK) IMPLANT
STENT BALLN UTERINE 3CM 6FR (Stent) IMPLANT
STENT BALLN UTERINE 4CM 6FR (STENTS) IMPLANT
STERLING MEDICAL ×3 IMPLANT
SUT SILK 2 0 SH (SUTURE) IMPLANT
SUT SILK 3 0 PS 1 (SUTURE) IMPLANT
SYR 20CC LL (SYRINGE) ×3 IMPLANT
SYR 3ML 18GX1 1/2 (SYRINGE) ×3 IMPLANT
SYR CONTROL 10ML LL (SYRINGE) ×3 IMPLANT
TOWEL OR 17X24 6PK STRL BLUE (TOWEL DISPOSABLE) ×6 IMPLANT
TRAY DSU PREP LF (CUSTOM PROCEDURE TRAY) ×3 IMPLANT
TUBING HYDROFLEX HYSTEROSCOPY (TUBING) ×3 IMPLANT
WATER STERILE IRR 500ML POUR (IV SOLUTION) ×3 IMPLANT

## 2013-05-14 NOTE — Op Note (Signed)
OPERATIVE NOTE  Preoperative diagnosis: Endometrial polyp, history of infertility Postoperative diagnosis: The same  Procedure: Hysteroscopy, polypectomy, D&C, intraoperative hysterosalpingography with interpretation  Surgeon: Fermin SchwabYALCINKAYA,Burr Soffer  Anesthesia: General  Complications: None  Estimated blood loss: Less than 20 mL  Specimen: Endometrial polyps and curettings to pathology  Findings: Endocervical canal appeared normal. The uterus sounded to 7 cm. Endometrial cavity had menstrual endometrium Otherwise it was of normal appearance and normal configuration. There was a 1 x 1 cm edematous polyp to the were left of midline at the fundus and a 0.5 x 0.5 mm polyp on the posterior fundal aspect of the uterus. In addition there was a polypoid fold alongside the right lateral wall of the uterus. Both tubal ostia were seen. HSG, the uterine cavity was normal except for the polypoid filling defects both fallopian tubes filled and spilled  Description of procedure: Patient was placed in dorsal supine position. General anesthesia was administered. She was placed in lithotomy position. She was prepped and draped in sterile manner. A vaginal speculum was placed. A dilute vasopressin solution containing 0.33 units per milliliter was injected into the cervical stroma x5 cc. A Slimline hysteroscope with 12 lens was inserted into the canal and above findings were noted. Distention medium was normal saline. Distention method was a hysteroscopic pump set at 80 mm mercury. Above findings were noted. Using hysteroscopic scissors each polyp was separately excised and removed with hysteroscopic graspers and sent to pathology. In addition a bicarbonate out the polypoid fold alongside the right lateral wall of the endometrial cavity and in addition removed the stalk with hysteroscopic graspers. Next the uterus was sounded and a sharp curet was inserted to focally scrape out the polypoid tissue from the right  lateral aspect of the endometrial cavity. Hemostasis was insured. Instrument count was correct. Estimated blood loss was less than 20 mL. The patient tolerated the procedure well and was transferred to recovery in satisfactory condition.  Fermin SchwabYALCINKAYA,Cord Wilczynski

## 2013-05-14 NOTE — Discharge Instructions (Signed)
°  Post Anesthesia Home Care Instructions  Activity: Get plenty of rest for the remainder of the day. A responsible adult should stay with you for 24 hours following the procedure.  For the next 24 hours, DO NOT: -Drive a car -Advertising copywriterperate machinery -Drink alcoholic beverages -Take any medication unless instructed by your physician -Make any legal decisions or sign important papers.  Meals: Start with liquid foods such as gelatin or soup. Progress to regular foods as tolerated. Avoid greasy, spicy, heavy foods. If nausea and/or vomiting occur, drink only clear liquids until the nausea and/or vomiting subsides. Call your physician if vomiting continues.  Special Instructions/Symptoms: Your throat may feel dry or sore from the anesthesia or the breathing tube placed in your throat during surgery. If this causes discomfort, gargle with warm salt water. The discomfort should disappear within 24 hours.   D & C Home care Instructions:   Personal hygiene:  Used sanitary napkins for vaginal drainage not tampons. Shower or tub bathe the day after your procedure. No douching until bleeding stops. Always wipe from front to back after  Elimination.  Activity: Do not drive or operate any equipment today. The effects of the anesthesia are still present and drowsiness may result. Rest today, not necessarily flat bed rest, just take it easy. You may resume your normal activity in one to 2 days.  Sexual activity: No intercourse for one week or as indicated by your physician  Diet: Eat a light diet as desired this evening. You may resume a regular diet tomorrow.  Return to work: One to 2 days.  General Expectations of your surgery: Vaginal bleeding should be no heavier than a normal period. Spotting may continue up to 10 days. Mild cramps may continue for a couple of days. You may have a regular period in 2-6 weeks.  Unexpected observations call your doctor if these occur: persistent or heavy bleeding.  Severe abdominal cramping or pain. Elevation of temperature greater than 100F.  Call for an appointment in 2 weeks.    Patient's Signature_______________________________________________________  Nurse's Signature________________________________________________________

## 2013-05-14 NOTE — H&P (Addendum)
Judith Jackson is a 33 y.o. female , originally referred to me by [facility]. She was noted to have an endometrial polyp. Patient is a new relationship. She would like to get pregnant in the near future. 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   Pertinent Gynecological History: Menses: flow is excessive with use of 2 pads or tampons on heaviest days Contraception: none DES exposure: denies Blood transfusions: none Sexually transmitted diseases: no past history  Menstrual History: Menarche age: 69 No LMP recorded.    Past Medical History  Diagnosis Date  . Anxiety   . Headache(784.0)     sinus/ migraine headaches  . Uterine polyp   . PONV (postoperative nausea and vomiting)   . Chronic rectal pain   . Wears contact lenses                     Past Surgical History  Procedure Laterality Date  . Evaluation under anesthesia with hemorrhoidectomy N/A 11/15/2012    Procedure: EXAM UNDER ANESTHESIA WITH  HEMORRHOIDECTOMY;  Surgeon: Romie Levee, MD;  Location: [facility];  Service: General;  Laterality: N/A;             History reviewed. No pertinent family history. No hereditary disease.  No cancer of breast, ovary, uterus. No cutaneous leiomyomatosis or renal cell carcinoma.  History   Social History  . Marital Status: Divorced    Spouse Name: N/A    Number of Children: N/A  . Years of Education: N/A   Occupational History  . Not on file.   Social History Main Topics  . Smoking status: Never Smoker   . Smokeless tobacco: Never Used  . Alcohol Use: No     Comment: socially  . Drug Use: No  . Sexual Activity: Not on file   Other Topics Concern  . Not on file   Social History Narrative  . No narrative on file    Allergies  Allergen Reactions  . Demerol [Meperidine] Other (See Comments)    halluninations    No current facility-administered medications on file prior to encounter.   No current  outpatient prescriptions on file prior to encounter.     Review of Systems  Constitutional: Negative.   HENT: Negative.   Eyes: Negative.   Respiratory: Negative.   Cardiovascular: Negative.   Gastrointestinal: Negative.   Genitourinary: Negative.   Musculoskeletal: Negative.   Skin: Negative.   Neurological: Negative.   Endo/Heme/Allergies: Negative.   Psychiatric/Behavioral: Negative.      Physical Exam  BP 113/73  Pulse 65  Temp(Src) 97.2 F (36.2 C) (Oral)  Resp 16  Ht 5\' 2"  (1.575 m)  Wt 69.174 kg (152 lb 8 oz)  BMI 27.89 kg/m2  SpO2 99%  LMP 04/16/2013 Constitutional: She is oriented to person, place, and time. She appears well-developed and well-nourished.  HENT:  Head: Normocephalic and atraumatic.  Nose: Nose normal.  Mouth/Throat: Oropharynx is clear and moist. No oropharyngeal exudate.  Eyes: Conjunctivae normal and EOM are normal. Pupils are equal, round, and reactive to light. No scleral icterus.  Neck: Normal range of motion. Neck supple. No tracheal deviation present. No thyromegaly present.  Cardiovascular: Normal rate.   Respiratory: Effort normal and breath sounds normal.  GI: Soft. Bowel sounds are normal. She exhibits no distension and no mass. There is no tenderness.  Lymphadenopathy:    She has no cervical adenopathy.  Neurological: She is alert and oriented to  person, place, and time. She has normal reflexes.  Skin: Skin is warm.  Psychiatric: She has a normal mood and affect. Her behavior is normal. Judgment and thought content normal.    Assessment/Plan:  Endometrial polyp Desire to conceive Hysteroscopy, polypectomy, intraoperative hysterosalpingogram Benefits, risks, and anticipated consequences were reviewed with the patient. All her questions were answered.  Fermin SchwabYALCINKAYA,Hannahgrace Lalli

## 2013-05-14 NOTE — Transfer of Care (Signed)
Immediate Anesthesia Transfer of Care Note  Patient: Judith Jackson  Procedure(s) Performed: Procedure(s) (LRB): HYSTEROSCOPY POLYPECTOMY  DILITATIONAND CURETTAGE, INTRAOP HYSTEROSALPINGOGRAM  (N/A)  Patient Location: PACU  Anesthesia Type: General  Level of Consciousness: awake, sedated, patient cooperative and responds to stimulation  Airway & Oxygen Therapy: Patient Spontanous Breathing and Patient connected to face mask oxygen  Post-op Assessment: Report given to PACU RN, Post -op Vital signs reviewed and stable and Patient moving all extremities  Post vital signs: Reviewed and stable  Complications: No apparent anesthesia complications

## 2013-05-14 NOTE — Anesthesia Procedure Notes (Signed)
Procedure Name: Intubation Date/Time: 05/14/2013 4:34 PM Performed by: Jessica PriestBEESON, Judith Perlmutter C Pre-anesthesia Checklist: Patient identified, Emergency Drugs available, Suction available and Patient being monitored Patient Re-evaluated:Patient Re-evaluated prior to inductionOxygen Delivery Method: Circle System Utilized Preoxygenation: Pre-oxygenation with 100% oxygen Intubation Type: IV induction Ventilation: Mask ventilation without difficulty Laryngoscope Size: Mac and 3 Grade View: Grade I Tube type: Oral Tube size: 7.0 mm Number of attempts: 1 Airway Equipment and Method: stylet and oral airway Placement Confirmation: ETT inserted through vocal cords under direct vision,  positive ETCO2 and breath sounds checked- equal and bilateral Secured at: 22 cm Tube secured with: Tape Dental Injury: Teeth and Oropharynx as per pre-operative assessment

## 2013-05-14 NOTE — Anesthesia Preprocedure Evaluation (Addendum)
Anesthesia Evaluation  Patient identified by MRN, date of birth, ID band Patient awake    Reviewed: Allergy & Precautions, H&P , NPO status , Patient's Chart, lab work & pertinent test results  History of Anesthesia Complications (+) PONV and history of anesthetic complications  Airway Mallampati: II TM Distance: >3 FB Neck ROM: Full    Dental no notable dental hx.    Pulmonary neg pulmonary ROS,  breath sounds clear to auscultation  Pulmonary exam normal       Cardiovascular negative cardio ROS  Rhythm:Regular Rate:Normal     Neuro/Psych  Headaches, Anxiety    GI/Hepatic negative GI ROS, Neg liver ROS,   Endo/Other  negative endocrine ROS  Renal/GU negative Renal ROS  negative genitourinary   Musculoskeletal negative musculoskeletal ROS (+)   Abdominal   Peds negative pediatric ROS (+)  Hematology negative hematology ROS (+)   Anesthesia Other Findings   Reproductive/Obstetrics negative OB ROS                           Anesthesia Physical Anesthesia Plan  ASA: I  Anesthesia Plan: General   Post-op Pain Management:    Induction: Intravenous  Airway Management Planned: LMA  Additional Equipment:   Intra-op Plan:   Post-operative Plan: Extubation in OR  Informed Consent: I have reviewed the patients History and Physical, chart, labs and discussed the procedure including the risks, benefits and alternatives for the proposed anesthesia with the patient or authorized representative who has indicated his/her understanding and acceptance.   Dental advisory given  Plan Discussed with: CRNA  Anesthesia Plan Comments:         Anesthesia Quick Evaluation

## 2013-05-14 NOTE — Anesthesia Postprocedure Evaluation (Signed)
  Anesthesia Post-op Note  Patient: Judith Jackson  Procedure(s) Performed: Procedure(s) (LRB): HYSTEROSCOPY POLYPECTOMY  DILITATIONAND CURETTAGE, INTRAOP HYSTEROSALPINGOGRAM  (N/A)  Patient Location: PACU  Anesthesia Type: General  Level of Consciousness: awake and alert   Airway and Oxygen Therapy: Patient Spontanous Breathing  Post-op Pain: mild  Post-op Assessment: Post-op Vital signs reviewed, Patient's Cardiovascular Status Stable, Respiratory Function Stable, Patent Airway and No signs of Nausea or vomiting  Last Vitals:  Filed Vitals:   05/14/13 1727  BP: 130/81  Pulse: 72  Temp: 36.9 C  Resp: 14    Post-op Vital Signs: stable   Complications: No apparent anesthesia complications

## 2013-05-20 ENCOUNTER — Encounter (HOSPITAL_BASED_OUTPATIENT_CLINIC_OR_DEPARTMENT_OTHER): Payer: Self-pay | Admitting: Obstetrics and Gynecology

## 2013-07-03 ENCOUNTER — Other Ambulatory Visit: Payer: Self-pay

## 2013-08-05 ENCOUNTER — Other Ambulatory Visit: Payer: Self-pay

## 2013-10-31 ENCOUNTER — Telehealth (INDEPENDENT_AMBULATORY_CARE_PROVIDER_SITE_OTHER): Payer: Self-pay

## 2013-10-31 NOTE — Telephone Encounter (Signed)
Pt is having pain from anal fissure. Nifedipine 2% ointment 3 grams called to [facility] .  Approved by Dr. Ezzard Standing.  Pt is aware.

## 2013-11-03 NOTE — Telephone Encounter (Signed)
Ok. But pt belongs to Dr Maisie Fus. If needs f/u, she needs to see her

## 2014-03-02 ENCOUNTER — Other Ambulatory Visit: Payer: Self-pay

## 2014-03-03 ENCOUNTER — Encounter: Payer: Self-pay | Admitting: Neurology

## 2014-03-03 ENCOUNTER — Ambulatory Visit (INDEPENDENT_AMBULATORY_CARE_PROVIDER_SITE_OTHER): Payer: BLUE CROSS/BLUE SHIELD | Admitting: Neurology

## 2014-03-03 VITALS — BP 102/68 | HR 68 | Temp 98.6°F | Resp 18 | Ht 62.0 in | Wt 138.0 lb

## 2014-03-03 DIAGNOSIS — G44221 Chronic tension-type headache, intractable: Secondary | ICD-10-CM

## 2014-03-03 MED ORDER — SERTRALINE HCL 25 MG PO TABS: 25.0000 mg | ORAL_TABLET | Freq: Every day | ORAL | Status: DC

## 2014-03-03 NOTE — Progress Notes (Signed)
NEUROLOGY CONSULTATION NOTE  Valoree Agent MRN: [id number] DOB: May 26, 1980  Referring provider: Dr. Sherril Croon Primary care provider: Dr. Sherril Croon  Reason for consult:  headache  HISTORY OF PRESENT ILLNESS: Judith Jackson is a 34 year old right-handed woman who presents for headache.  Records and MRI of brain personally reviewed.  Onset:  2 months ago Location:  Starts from back of head and travels up to bi-frontal region Quality:  Throbbing sensation in back of head and pressure Aura:  no Prodrome:  no Associated symptoms:  Rarely experienced some nausea and blurry vision when severe.  Otherwise no associated symptoms Duration:  Almost constant Frequency:  daily Triggers/exacerbating factors:  Probably stress. Relieving factors:  Sometimes ibuprofen Activity:  Able to function  Past abortive therapy:  none Past preventative therapy:  none  Current abortive therapy:  Ibuprofen  (was taking it frequently) Current preventative therapy:  none  Caffeine:  stopped Alcohol:  stopped Smoker:  no Diet:  Tries to eat healthy.  Stopped chocolate, cheese.  Drinks a lot of water Exercise:  no Depression/stress:  Has had significant stress.  Her father was diagnosed with metastatic cancer with mets to the brain.  He is up in [place] at this time. Sleep hygiene:  poor Family history of headache:  Mom has migraines  PAST MEDICAL HISTORY: Past Medical History  Diagnosis Date  . Anxiety   . Headache(784.0)     sinus/ migraine headaches  . Uterine polyp   . PONV (postoperative nausea and vomiting)   . Chronic rectal pain   . Wears contact lenses     PAST SURGICAL HISTORY: Past Surgical History  Procedure Laterality Date  . Evaluation under anesthesia with hemorrhoidectomy N/A 11/15/2012    Procedure: EXAM UNDER ANESTHESIA WITH  HEMORRHOIDECTOMY;  Surgeon: Romie Levee, MD;  Location: [facility];  Service: General;  Laterality: N/A;  . Hysteroscopy N/A 05/14/2013   Procedure: HYSTEROSCOPY POLYPECTOMY  DILITATIONAND CURETTAGE, INTRAOP HYSTEROSALPINGOGRAM ;  Surgeon: Fermin Schwab, MD;  Location: [facility];  Service: Gynecology;  Laterality: N/A;    MEDICATIONS: No current outpatient prescriptions on file prior to visit.   No current facility-administered medications on file prior to visit.    ALLERGIES: Allergies  Allergen Reactions  . Demerol [Meperidine] Other (See Comments)    halluninations    FAMILY HISTORY: Family History  Problem Relation Age of Onset  . Cancer Father     bladder  . Cancer Maternal Grandfather   . Cancer Paternal Grandfather     SOCIAL HISTORY: History   Social History  . Marital Status: Divorced    Spouse Name: N/A    Number of Children: N/A  . Years of Education: N/A   Occupational History  . Not on file.   Social History Main Topics  . Smoking status: Never Smoker   . Smokeless tobacco: Never Used  . Alcohol Use: No     Comment: socially  . Drug Use: No  . Sexual Activity: Yes     Comment: patient refused    Other Topics Concern  . Not on file   Social History Narrative    REVIEW OF SYSTEMS: Constitutional: No fevers, chills, or sweats, no generalized fatigue, change in appetite Eyes: No visual changes, double vision, eye pain Ear, nose and throat: No hearing loss, ear pain, nasal congestion, sore throat Cardiovascular: No chest pain, palpitations Respiratory:  No shortness of breath at rest or with exertion, wheezes GastrointestinaI: No nausea, vomiting, diarrhea, abdominal  pain, fecal incontinence Genitourinary:  No dysuria, urinary retention or frequency Musculoskeletal:  No neck pain, back pain Integumentary: No rash, pruritus, skin lesions Neurological: as above Psychiatric: depression, insomnia Endocrine: No palpitations, fatigue, diaphoresis, mood swings, change in appetite, change in weight, increased thirst Hematologic/Lymphatic:  No anemia, purpura,  petechiae. Allergic/Immunologic: no itchy/runny eyes, nasal congestion, recent allergic reactions, rashes  PHYSICAL EXAM: Filed Vitals:   03/03/14 1507  BP: 102/68  Pulse: 68  Temp: 98.6 F (37 C)  Resp: 18   General: No acute distress Head:  Normocephalic/atraumatic Eyes:  fundi unremarkable, without vessel changes, exudates, hemorrhages or papilledema. Neck: supple, no paraspinal tenderness, full range of motion Back: No paraspinal tenderness Heart: regular rate and rhythm Lungs: Clear to auscultation bilaterally. Vascular: No carotid bruits. Neurological Exam: Mental status: alert and oriented to person, place, and time, recent and remote memory intact, fund of knowledge intact, attention and concentration intact, speech fluent and not dysarthric, language intact. Cranial nerves: CN I: not tested CN II: pupils equal, round and reactive to light, visual fields intact, fundi unremarkable, without vessel changes, exudates, hemorrhages or papilledema. CN III, IV, VI:  full range of motion, no nystagmus, no ptosis CN V: facial sensation intact CN VII: upper and lower face symmetric CN VIII: hearing intact CN IX, X: gag intact, uvula midline CN XI: sternocleidomastoid and trapezius muscles intact CN XII: tongue midline Bulk & Tone: normal, no fasciculations. Motor:  5/5 throughout Sensation:  Temperature and vibration intact Deep Tendon Reflexes:  2+ throughout, toes downgoing Finger to nose testing:  No dysmetria Heel to shin:  No dysmetria Gait:  Normal station and stride.  Able to turn and walk in tandem. Romberg negative.  IMPRESSION: Chronic tension type headaches complicated by medication overuse.  Current family stressors regarding her father is playing a prominent role.  PLAN: 1.  We will start Zoloft (sertraline) 25mg  at bedtime.  This will help reduce frequency of headaches. 2.  For abortive therapy, take Excedrin migraine or tension at earliest onset of headache.   Take no more than two days out of the week.  If ineffective, you can again try ibuprofen 3.  Improve diet and start regular exercise and improve sleep hygiene 4.  Recommend therapy regarding family stressors 5.  Keep a headache diary 6.  Stay away from processed meats, cheeses and wine. 7.  Follow up in 3 months but call in 4 weeks with update.  Thank you for allowing me to take part in the care of this patient.  Shon MilletAdam Jamai Dolce, DO  CC:  Doreen Beamhruv Vyas, MD

## 2014-03-03 NOTE — Patient Instructions (Signed)
I think you are having tension headaches. 1.  We will start Zoloft (sertraline) 25mg  at bedtime.  This will help reduce frequency of headaches. 2.  When you get a headache, take Excedrin migraine or tension at earliest onset of headache.  Take no more than two days out of the week.  If ineffective, you can again try ibuprofen 3.  Improve diet and start regular exercise 4.  Recommend therapy regarding family stressors 5.  Keep a headache diary 6.  Stay away from processed meats, cheeses and wine. 7.  Follow up in 3 months but call in 4 weeks with update.

## 2014-03-04 LAB — CYTOLOGY - PAP

## 2014-06-05 ENCOUNTER — Ambulatory Visit: Payer: BLUE CROSS/BLUE SHIELD | Admitting: Neurology

## 2014-07-08 ENCOUNTER — Emergency Department (HOSPITAL_COMMUNITY): Payer: BLUE CROSS/BLUE SHIELD

## 2014-07-08 ENCOUNTER — Encounter (HOSPITAL_COMMUNITY): Payer: Self-pay | Admitting: *Deleted

## 2014-07-08 ENCOUNTER — Emergency Department (HOSPITAL_COMMUNITY)
Admission: EM | Admit: 2014-07-08 | Discharge: 2014-07-08 | Disposition: A | Discharge status: Home / Self Care | Payer: BLUE CROSS/BLUE SHIELD | Attending: Emergency Medicine | Admitting: Emergency Medicine

## 2014-07-08 ENCOUNTER — Emergency Department (HOSPITAL_COMMUNITY)
Admission: EM | Admit: 2014-07-08 | Discharge: 2014-07-08 | Disposition: A | Discharge status: Other Acute Inpatient Hospital | Payer: BLUE CROSS/BLUE SHIELD | Source: Home / Self Care | Attending: Family Medicine | Admitting: Family Medicine

## 2014-07-08 ENCOUNTER — Encounter (HOSPITAL_COMMUNITY): Payer: Self-pay | Admitting: Emergency Medicine

## 2014-07-08 DIAGNOSIS — F419 Anxiety disorder, unspecified: Secondary | ICD-10-CM | POA: Diagnosis not present

## 2014-07-08 DIAGNOSIS — G8929 Other chronic pain: Secondary | ICD-10-CM | POA: Diagnosis not present

## 2014-07-08 DIAGNOSIS — R1084 Generalized abdominal pain: Secondary | ICD-10-CM | POA: Insufficient documentation

## 2014-07-08 DIAGNOSIS — Z79899 Other long term (current) drug therapy: Secondary | ICD-10-CM | POA: Diagnosis not present

## 2014-07-08 DIAGNOSIS — R1011 Right upper quadrant pain: Secondary | ICD-10-CM | POA: Diagnosis not present

## 2014-07-08 DIAGNOSIS — R109 Unspecified abdominal pain: Secondary | ICD-10-CM

## 2014-07-08 DIAGNOSIS — Z8742 Personal history of other diseases of the female genital tract: Secondary | ICD-10-CM | POA: Diagnosis not present

## 2014-07-08 DIAGNOSIS — Z3202 Encounter for pregnancy test, result negative: Secondary | ICD-10-CM | POA: Insufficient documentation

## 2014-07-08 LAB — COMPREHENSIVE METABOLIC PANEL
ALT: 14 U/L (ref 14–54)
AST: 16 U/L (ref 15–41)
Albumin: 4.1 g/dL (ref 3.5–5.0)
Alkaline Phosphatase: 53 U/L (ref 38–126)
Anion gap: 9 (ref 5–15)
BUN: 10 mg/dL (ref 6–20)
CHLORIDE: 102 mmol/L (ref 101–111)
CO2: 25 mmol/L (ref 22–32)
Calcium: 9.3 mg/dL (ref 8.9–10.3)
Creatinine, Ser: 0.78 mg/dL (ref 0.44–1.00)
GFR calc Af Amer: >60 mL/min (ref >60)
GFR calc non Af Amer: >60 mL/min (ref >60)
GLUCOSE: 92 mg/dL (ref 65–99)
Potassium: 4.3 mmol/L (ref 3.5–5.1)
Sodium: 136 mmol/L (ref 135–145)
TOTAL PROTEIN: 7.1 g/dL (ref 6.5–8.1)
Total Bilirubin: 0.7 mg/dL (ref 0.3–1.2)

## 2014-07-08 LAB — URINE MICROSCOPIC-ADD ON

## 2014-07-08 LAB — URINALYSIS, ROUTINE W REFLEX MICROSCOPIC
Bilirubin Urine: NEGATIVE
Glucose, UA: NEGATIVE mg/dL
KETONES UR: NEGATIVE mg/dL
Leukocytes, UA: NEGATIVE
Nitrite: NEGATIVE
PH: 5.5 (ref 5.0–8.0)
PROTEIN: NEGATIVE mg/dL
Specific Gravity, Urine: 1.025 (ref 1.005–1.030)
Urobilinogen, UA: 0.2 mg/dL (ref 0.0–1.0)

## 2014-07-08 LAB — POCT URINALYSIS DIP (DEVICE)
Bilirubin Urine: NEGATIVE
Glucose, UA: NEGATIVE mg/dL
KETONES UR: NEGATIVE mg/dL
Leukocytes, UA: NEGATIVE
NITRITE: NEGATIVE
PH: 6 (ref 5.0–8.0)
PROTEIN: NEGATIVE mg/dL
Specific Gravity, Urine: >=1.03 (ref 1.005–1.030)
Urobilinogen, UA: 0.2 mg/dL (ref 0.0–1.0)

## 2014-07-08 LAB — CBC WITH DIFFERENTIAL/PLATELET
Basophils Absolute: 0 10*3/uL (ref 0.0–0.1)
Basophils Relative: 0 % (ref 0–1)
Eosinophils Absolute: 0.1 10*3/uL (ref 0.0–0.7)
Eosinophils Relative: 1 % (ref 0–5)
HEMATOCRIT: 36.8 % (ref 36.0–46.0)
Hemoglobin: 12.5 g/dL (ref 12.0–15.0)
LYMPHS PCT: 23 % (ref 12–46)
Lymphs Abs: 3 10*3/uL (ref 0.7–4.0)
MCH: 31 pg (ref 26.0–34.0)
MCHC: 34 g/dL (ref 30.0–36.0)
MCV: 91.3 fL (ref 78.0–100.0)
Monocytes Absolute: 0.7 10*3/uL (ref 0.1–1.0)
Monocytes Relative: 5 % (ref 3–12)
NEUTROS ABS: 9.1 10*3/uL — AB (ref 1.7–7.7)
Neutrophils Relative %: 71 % (ref 43–77)
PLATELETS: 325 10*3/uL (ref 150–400)
RBC: 4.03 MIL/uL (ref 3.87–5.11)
RDW: 12.6 % (ref 11.5–15.5)
WBC: 12.9 10*3/uL — ABNORMAL HIGH (ref 4.0–10.5)

## 2014-07-08 LAB — POCT PREGNANCY, URINE: Preg Test, Ur: NEGATIVE

## 2014-07-08 LAB — POC URINE PREG, ED: Preg Test, Ur: NEGATIVE

## 2014-07-08 MED ORDER — ONDANSETRON HCL 4 MG/2ML IJ SOLN
4.0000 mg | Freq: Once | INTRAMUSCULAR | Status: AC
  Administered 2014-07-08: 4 mg via INTRAVENOUS
  Filled 2014-07-08: qty 2

## 2014-07-08 MED ORDER — MORPHINE SULFATE 4 MG/ML IJ SOLN
4.0000 mg | Freq: Once | INTRAMUSCULAR | Status: AC
  Administered 2014-07-08: 4 mg via INTRAVENOUS
  Filled 2014-07-08: qty 1

## 2014-07-08 NOTE — ED Notes (Signed)
Pt c/o right sided lower abd pain into flank and back x several days

## 2014-07-08 NOTE — ED Notes (Signed)
Pt is here with complaints of right flank pain radiating to lower abdomen. Pt reports urinary frequency and occasional burning. Pt is trying to get pregnant.

## 2014-07-08 NOTE — Discharge Instructions (Signed)
Magnesium citrate: Drink the entire 10 ounce bottle mixed with equal parts Sprite or Gatorade.  Return to the emergency department for worsening pain, high fever, bloody stool, or other new and concerning symptoms.   Abdominal Pain Many things can cause abdominal pain. Usually, abdominal pain is not caused by a disease and will improve without treatment. It can often be observed and treated at home. Your health care provider will do a physical exam and possibly order blood tests and X-rays to help determine the seriousness of your pain. However, in many cases, more time must pass before a clear cause of the pain can be found. Before that point, your health care provider may not know if you need more testing or further treatment. HOME CARE INSTRUCTIONS  Monitor your abdominal pain for any changes. The following actions may help to alleviate any discomfort you are experiencing:  Only take over-the-counter or prescription medicines as directed by your health care provider.  Do not take laxatives unless directed to do so by your health care provider.  Try a clear liquid diet (broth, tea, or water) as directed by your health care provider. Slowly move to a bland diet as tolerated. SEEK MEDICAL CARE IF:  You have unexplained abdominal pain.  You have abdominal pain associated with nausea or diarrhea.  You have pain when you urinate or have a bowel movement.  You experience abdominal pain that wakes you in the night.  You have abdominal pain that is worsened or improved by eating food.  You have abdominal pain that is worsened with eating fatty foods.  You have a fever. SEEK IMMEDIATE MEDICAL CARE IF:   Your pain does not go away within 2 hours.  You keep throwing up (vomiting).  Your pain is felt only in portions of the abdomen, such as the right side or the left lower portion of the abdomen.  You pass bloody or black tarry stools. MAKE SURE YOU:  Understand these instructions.    Will watch your condition.   Will get help right away if you are not doing well or get worse.  Document Released: 11/09/2004 Document Revised: 02/04/2013 Document Reviewed: 10/09/2012 [facility] Center[facility] Patient Information 2015 [place], [place]. This information is not intended to replace advice given to you by your health care provider. Make sure you discuss any questions you have with your health care provider.

## 2014-07-08 NOTE — ED Notes (Signed)
Patient transported to CT 

## 2014-07-08 NOTE — ED Provider Notes (Signed)
Judith Jackson is a 34 y.o. female who presents to Urgent Care today for right flank pain and pelvis pain. Patient has had 3 weeks of mild pelvis and flank pain. Today the pain became severe. She is uncomfortable sitting. She denies any urinary frequency urgency or dysuria. She states the pain is somewhat consistent with her previous episodes of pyelonephritis.  She denies any vomiting but does have some diarrhea and has been nauseated times. Her last menstrual period was May 4. She takes Clomid in an attempt to become pregnant.   Past Medical History  Diagnosis Date  . Anxiety   . Headache(784.0)     sinus/ migraine headaches  . Uterine polyp   . PONV (postoperative nausea and vomiting)   . Chronic rectal pain   . Wears contact lenses    Past Surgical History  Procedure Laterality Date  . Evaluation under anesthesia with hemorrhoidectomy N/A 11/15/2012    Procedure: EXAM UNDER ANESTHESIA WITH  HEMORRHOIDECTOMY;  Surgeon: Romie Levee, MD;  Location: [facility];  Service: General;  Laterality: N/A;  . Hysteroscopy N/A 05/14/2013    Procedure: HYSTEROSCOPY POLYPECTOMY  DILITATIONAND CURETTAGE, INTRAOP HYSTEROSALPINGOGRAM ;  Surgeon: Fermin Schwab, MD;  Location: [facility];  Service: Gynecology;  Laterality: N/A;   History  Substance Use Topics  . Smoking status: Never Smoker   . Smokeless tobacco: Never Used  . Alcohol Use: No     Comment: socially   ROS as above Medications: No current facility-administered medications for this encounter.   Current Outpatient Prescriptions  Medication Sig Dispense Refill  . clomiPHENE (CLOMID) 50 MG tablet Take by mouth daily.    Marland Kitchen amoxicillin (AMOXIL) 500 MG capsule   0  . clonazePAM (KLONOPIN) 0.5 MG tablet   1  . FLUoxetine (PROZAC) 20 MG capsule Take 20 mg by mouth daily.  2  . ibuprofen (ADVIL,MOTRIN) 800 MG tablet   0  . sertraline (ZOLOFT) 25 MG tablet Take 1 tablet (25 mg total) by mouth daily. 30 tablet 0    Allergies  Allergen Reactions  . Demerol [Meperidine] Other (See Comments)    halluninations     Exam:  BP 123/84 mmHg  Pulse 84  Temp(Src) 98.5 F (36.9 C) (Oral)  SpO2 97%  LMP 06/17/2014 Gen: Well NAD HEENT: EOMI,  MMM Lungs: Normal work of breathing. CTABL Heart: RRR no MRG Abd: NABS, Soft. Nondistended, TTP URQ. No significant rebound. Tender to percussion right CV angle Exts: Brisk capillary refill, warm and well perfused.   Results for orders placed or performed during the hospital encounter of 07/08/14 (from the past 24 hour(s))  POCT urinalysis dip (device)     Status: Abnormal   Collection Time: 07/08/14 11:16 AM  Result Value Ref Range   Glucose, UA NEGATIVE NEGATIVE mg/dL   Bilirubin Urine NEGATIVE NEGATIVE   Ketones, ur NEGATIVE NEGATIVE mg/dL   Specific Gravity, Urine >=1.030 1.005 - 1.030   Hgb urine dipstick MODERATE (A) NEGATIVE   pH 6.0 5.0 - 8.0   Protein, ur NEGATIVE NEGATIVE mg/dL   Urobilinogen, UA 0.2 0.0 - 1.0 mg/dL   Nitrite NEGATIVE NEGATIVE   Leukocytes, UA NEGATIVE NEGATIVE  Pregnancy, urine POC     Status: None   Collection Time: 07/08/14 11:17 AM  Result Value Ref Range   Preg Test, Ur NEGATIVE NEGATIVE   No results found.  Assessment and Plan: 34 y.o. female with abdominal and flank pain. Pain is severe and dramatically worse. She does have  hematuria however review of her last 3 urinalysis show also hematuria. I'm not sure if this is a chronic or acute change. Transfer to emergency department to further evaluate the cause of her severe abdominal and flank pain. Differential this time includes kidney stone, gallstones, or some other serious process.  Discussed warning signs or symptoms. Please see discharge instructions. Patient expresses understanding.     Rodolph BongEvan S Zahira Brummond, MD 07/08/14 [phone]

## 2014-07-08 NOTE — ED Provider Notes (Signed)
CSN: [id number]     Arrival date & time 07/08/14  1224 History   First MD Initiated Contact with Patient 07/08/14 1401     Chief Complaint  Patient presents with  . Abdominal Pain  . Flank Pain     (Consider location/radiation/quality/duration/timing/severity/associated sxs/prior Treatment) HPI Comments: Patient is a 34 year old female with history of anxiety. She presents for evaluation of generalized abdominal discomfort for the past 2 weeks. Last night the pain worsened in her right flank. She denies any dysuria, however reports this is how it feels when she has had pyelonephritis. She denies any fevers or chills. She denies any blood in the urine. Her last menstrual period was 2 weeks ago and was normal. She reports taking some sort of fertility drug which thus far has been unsuccessful. She was seen at urgent care this afternoon, then sent here for further evaluation and possible CT scan.  Patient is a 34 y.o. female presenting with abdominal pain. The history is provided by the patient.  Abdominal Pain Pain location:  Generalized Pain quality: cramping   Pain radiates to:  Does not radiate Pain severity:  Moderate Onset quality:  Gradual Duration:  2 weeks Timing:  Constant Progression:  Worsening Chronicity:  New Relieved by:  Nothing Worsened by:  Nothing tried Ineffective treatments:  None tried Associated symptoms: no chills, no dysuria, no fever, no vaginal bleeding and no vaginal discharge     Past Medical History  Diagnosis Date  . Anxiety   . Headache(784.0)     sinus/ migraine headaches  . Uterine polyp   . PONV (postoperative nausea and vomiting)   . Chronic rectal pain   . Wears contact lenses    Past Surgical History  Procedure Laterality Date  . Evaluation under anesthesia with hemorrhoidectomy N/A 11/15/2012    Procedure: EXAM UNDER ANESTHESIA WITH  HEMORRHOIDECTOMY;  Surgeon: Romie Levee, MD;  Location: [facility];  Service: General;   Laterality: N/A;  . Hysteroscopy N/A 05/14/2013    Procedure: HYSTEROSCOPY POLYPECTOMY  DILITATIONAND CURETTAGE, INTRAOP HYSTEROSALPINGOGRAM ;  Surgeon: Fermin Schwab, MD;  Location: [facility];  Service: Gynecology;  Laterality: N/A;   Family History  Problem Relation Age of Onset  . Cancer Father     bladder  . Cancer Maternal Grandfather   . Cancer Paternal Grandfather    History  Substance Use Topics  . Smoking status: Never Smoker   . Smokeless tobacco: Never Used  . Alcohol Use: No     Comment: socially   OB History    No data available     Review of Systems  Constitutional: Negative for fever and chills.  Gastrointestinal: Positive for abdominal pain.  Genitourinary: Negative for dysuria, vaginal bleeding and vaginal discharge.  All other systems reviewed and are negative.     Allergies  Demerol  Home Medications   Prior to Admission medications   Medication Sig Start Date End Date Taking? Authorizing Provider  amoxicillin (AMOXIL) 500 MG capsule  02/03/14   Historical Provider, MD  clomiPHENE (CLOMID) 50 MG tablet Take by mouth daily.    Historical Provider, MD  clonazePAM (KLONOPIN) 0.5 MG tablet  12/19/13   Historical Provider, MD  FLUoxetine (PROZAC) 20 MG capsule Take 20 mg by mouth daily. 12/16/13   Historical Provider, MD  ibuprofen (ADVIL,MOTRIN) 800 MG tablet  12/26/13   Historical Provider, MD  sertraline (ZOLOFT) 25 MG tablet Take 1 tablet (25 mg total) by mouth daily. 03/03/14   Adam  R Jaffe, DO   BP 120/89 mmHg  Pulse 96  Temp(Src) 98.8 F (37.1 C) (Oral)  Resp 18  SpO2 100%  LMP 06/17/2014 Physical Exam  Constitutional: She is oriented to person, place, and time. She appears well-developed and well-nourished. No distress.  HENT:  Head: Normocephalic and atraumatic.  Neck: Normal range of motion. Neck supple.  Cardiovascular: Normal rate and regular rhythm.  Exam reveals no gallop and no friction rub.   No murmur  heard. Pulmonary/Chest: Effort normal and breath sounds normal. No respiratory distress. She has no wheezes.  Abdominal: Soft. Bowel sounds are normal. She exhibits no distension. There is tenderness. There is no rebound.  There is tenderness to palpation in the right lateral abdomen and right flank. There is also mild tenderness to palpation in all 4 quadrants with no rebound and no guarding.  Musculoskeletal: Normal range of motion.  Neurological: She is alert and oriented to person, place, and time.  Skin: Skin is warm and dry. She is not diaphoretic.  Nursing note and vitals reviewed.   ED Course  Procedures (including critical care time) Labs Review Labs Reviewed  CBC WITH DIFFERENTIAL/PLATELET - Abnormal; Notable for the following:    WBC 12.9 (*)    Neutro Abs 9.1 (*)    All other components within normal limits  URINALYSIS, ROUTINE W REFLEX MICROSCOPIC - Abnormal; Notable for the following:    APPearance HAZY (*)    Hgb urine dipstick LARGE (*)    All other components within normal limits  URINE MICROSCOPIC-ADD ON - Abnormal; Notable for the following:    Squamous Epithelial / LPF FEW (*)    Bacteria, UA MANY (*)    All other components within normal limits  COMPREHENSIVE METABOLIC PANEL  POC URINE PREG, ED    Imaging Review No results found.   EKG Interpretation None      MDM   Final diagnoses:  None    CT scan shows no evidence for acute intra-abdominal process. The only significant finding is an increased stool burden. I will recommend magnesium citrate and when necessary return.    Geoffery Lyonsouglas Tyona Nilsen, MD 07/08/14 [phone]

## 2014-07-08 NOTE — ED Notes (Signed)
MD at bedside. 

## 2014-07-10 ENCOUNTER — Encounter: Payer: Self-pay | Admitting: Internal Medicine

## 2014-07-15 ENCOUNTER — Other Ambulatory Visit: Payer: Self-pay

## 2014-07-16 LAB — CYTOLOGY - PAP

## 2014-09-08 ENCOUNTER — Other Ambulatory Visit: Payer: Self-pay

## 2014-09-08 LAB — OB RESULTS CONSOLE RUBELLA ANTIBODY, IGM: Rubella: IMMUNE

## 2014-09-08 LAB — OB RESULTS CONSOLE HEPATITIS B SURFACE ANTIGEN: Hepatitis B Surface Ag: NEGATIVE

## 2014-09-08 LAB — OB RESULTS CONSOLE RPR: RPR: NONREACTIVE

## 2014-09-08 LAB — OB RESULTS CONSOLE GC/CHLAMYDIA
CHLAMYDIA, DNA PROBE: NEGATIVE
GC PROBE AMP, GENITAL: NEGATIVE

## 2014-09-08 LAB — OB RESULTS CONSOLE HIV ANTIBODY (ROUTINE TESTING): HIV: NONREACTIVE

## 2014-09-08 LAB — OB RESULTS CONSOLE ABO/RH: RH Type: POSITIVE

## 2014-09-08 LAB — OB RESULTS CONSOLE ANTIBODY SCREEN: ANTIBODY SCREEN: NEGATIVE

## 2014-09-28 ENCOUNTER — Ambulatory Visit: Payer: BLUE CROSS/BLUE SHIELD | Admitting: Internal Medicine

## 2015-01-30 ENCOUNTER — Encounter (HOSPITAL_COMMUNITY): Payer: Self-pay | Admitting: *Deleted

## 2015-01-30 ENCOUNTER — Inpatient Hospital Stay (HOSPITAL_COMMUNITY)
Admission: AD | Admit: 2015-01-30 | Discharge: 2015-01-30 | Disposition: A | Discharge status: Home / Self Care | Payer: BLUE CROSS/BLUE SHIELD | Source: Ambulatory Visit | Attending: Obstetrics and Gynecology | Admitting: Obstetrics and Gynecology

## 2015-01-30 DIAGNOSIS — Z3689 Encounter for other specified antenatal screening: Secondary | ICD-10-CM

## 2015-01-30 DIAGNOSIS — Z3A32 32 weeks gestation of pregnancy: Secondary | ICD-10-CM | POA: Insufficient documentation

## 2015-01-30 DIAGNOSIS — O368130 Decreased fetal movements, third trimester, not applicable or unspecified: Secondary | ICD-10-CM | POA: Diagnosis present

## 2015-01-30 NOTE — MAU Note (Signed)
Pt was not discharged in [facility] so strip charting that ended around 1355 on 01/30/2015 was from the next pt admitted to that room.

## 2015-01-30 NOTE — Discharge Instructions (Signed)
Fetal Movement Counts  Patient Name: __________________________________________________ Patient Due Date: ____________________  Performing a fetal movement count is highly recommended in high-risk pregnancies, but it is good for every pregnant woman to do. Your health care provider may ask you to start counting fetal movements at 28 weeks of the pregnancy. Fetal movements often increase:  · After eating a full meal.  · After physical activity.  · After eating or drinking something sweet or cold.  · At rest.  Pay attention to when you feel the baby is most active. This will help you notice a pattern of your baby's sleep and wake cycles and what factors contribute to an increase in fetal movement. It is important to perform a fetal movement count at the same time each day when your baby is normally most active.   HOW TO COUNT FETAL MOVEMENTS  1. Find a quiet and comfortable area to sit or lie down on your left side. Lying on your left side provides the best blood and oxygen circulation to your baby.  2. Write down the day and time on a sheet of paper or in a journal.  3. Start counting kicks, flutters, swishes, rolls, or jabs in a 2-hour period. You should feel at least 10 movements within 2 hours.  4. If you do not feel 10 movements in 2 hours, wait 2-3 hours and count again. Look for a change in the pattern or not enough counts in 2 hours.  SEEK MEDICAL CARE IF:  · You feel less than 10 counts in 2 hours, tried twice.  · There is no movement in over an hour.  · The pattern is changing or taking longer each day to reach 10 counts in 2 hours.  · You feel the baby is not moving as he or she usually does.  Date: ____________ Movements: ____________ Start time: ____________ Finish time: ____________   Date: ____________ Movements: ____________ Start time: ____________ Finish time: ____________  Date: ____________ Movements: ____________ Start time: ____________ Finish time: ____________  Date: ____________ Movements:  ____________ Start time: ____________ Finish time: ____________  Date: ____________ Movements: ____________ Start time: ____________ Finish time: ____________  Date: ____________ Movements: ____________ Start time: ____________ Finish time: ____________  Date: ____________ Movements: ____________ Start time: ____________ Finish time: ____________  Date: ____________ Movements: ____________ Start time: ____________ Finish time: ____________   Date: ____________ Movements: ____________ Start time: ____________ Finish time: ____________  Date: ____________ Movements: ____________ Start time: ____________ Finish time: ____________  Date: ____________ Movements: ____________ Start time: ____________ Finish time: ____________  Date: ____________ Movements: ____________ Start time: ____________ Finish time: ____________  Date: ____________ Movements: ____________ Start time: ____________ Finish time: ____________  Date: ____________ Movements: ____________ Start time: ____________ Finish time: ____________  Date: ____________ Movements: ____________ Start time: ____________ Finish time: ____________   Date: ____________ Movements: ____________ Start time: ____________ Finish time: ____________  Date: ____________ Movements: ____________ Start time: ____________ Finish time: ____________  Date: ____________ Movements: ____________ Start time: ____________ Finish time: ____________  Date: ____________ Movements: ____________ Start time: ____________ Finish time: ____________  Date: ____________ Movements: ____________ Start time: ____________ Finish time: ____________  Date: ____________ Movements: ____________ Start time: ____________ Finish time: ____________  Date: ____________ Movements: ____________ Start time: ____________ Finish time: ____________   Date: ____________ Movements: ____________ Start time: ____________ Finish time: ____________  Date: ____________ Movements: ____________ Start time: ____________ Finish  time: ____________  Date: ____________ Movements: ____________ Start time: ____________ Finish time: ____________  Date: ____________ Movements: ____________ Start time:   ____________ Finish time: ____________  Date: ____________ Movements: ____________ Start time: ____________ Finish time: ____________  Date: ____________ Movements: ____________ Start time: ____________ Finish time: ____________  Date: ____________ Movements: ____________ Start time: ____________ Finish time: ____________   Date: ____________ Movements: ____________ Start time: ____________ Finish time: ____________  Date: ____________ Movements: ____________ Start time: ____________ Finish time: ____________  Date: ____________ Movements: ____________ Start time: ____________ Finish time: ____________  Date: ____________ Movements: ____________ Start time: ____________ Finish time: ____________  Date: ____________ Movements: ____________ Start time: ____________ Finish time: ____________  Date: ____________ Movements: ____________ Start time: ____________ Finish time: ____________  Date: ____________ Movements: ____________ Start time: ____________ Finish time: ____________   Date: ____________ Movements: ____________ Start time: ____________ Finish time: ____________  Date: ____________ Movements: ____________ Start time: ____________ Finish time: ____________  Date: ____________ Movements: ____________ Start time: ____________ Finish time: ____________  Date: ____________ Movements: ____________ Start time: ____________ Finish time: ____________  Date: ____________ Movements: ____________ Start time: ____________ Finish time: ____________  Date: ____________ Movements: ____________ Start time: ____________ Finish time: ____________  Date: ____________ Movements: ____________ Start time: ____________ Finish time: ____________   Date: ____________ Movements: ____________ Start time: ____________ Finish time: ____________  Date: ____________  Movements: ____________ Start time: ____________ Finish time: ____________  Date: ____________ Movements: ____________ Start time: ____________ Finish time: ____________  Date: ____________ Movements: ____________ Start time: ____________ Finish time: ____________  Date: ____________ Movements: ____________ Start time: ____________ Finish time: ____________  Date: ____________ Movements: ____________ Start time: ____________ Finish time: ____________  Date: ____________ Movements: ____________ Start time: ____________ Finish time: ____________   Date: ____________ Movements: ____________ Start time: ____________ Finish time: ____________  Date: ____________ Movements: ____________ Start time: ____________ Finish time: ____________  Date: ____________ Movements: ____________ Start time: ____________ Finish time: ____________  Date: ____________ Movements: ____________ Start time: ____________ Finish time: ____________  Date: ____________ Movements: ____________ Start time: ____________ Finish time: ____________  Date: ____________ Movements: ____________ Start time: ____________ Finish time: ____________     This information is not intended to replace advice given to you by your health care provider. Make sure you discuss any questions you have with your health care provider.     Document Released: 03/01/2006 Document Revised: 02/20/2014 Document Reviewed: 11/27/2011  Elsevier Interactive Patient Education ©2016 [employer]

## 2015-01-30 NOTE — MAU Provider Note (Signed)
History     CSN: [id number]  Arrival date and time: 01/30/15 1148   First Provider Initiated Contact with Patient 01/30/15 1240      Chief Complaint  Patient presents with  . Decreased Fetal Movement   HPI   Ms. Judith Jackson is a 34 y.o. female G1P0 at [redacted]w[redacted]d presenting to MAU with decreased fetal movement. The movements changed as of yesterday. She called the office and they instructed the patient to come in yesterday, however the patient thought she would stick it out and see if movements improved.   Some days she feels a lot of fetal movement and other days she feels nothing. Since her arrival to MAU she has felt "some movements."  + pelvic pressure.  Denies vaginal bleeding or leaking of fluid.   OB History    Gravida Para Term Preterm AB TAB SAB Ectopic Multiple Living   1               Past Medical History  Diagnosis Date  . Anxiety   . Headache(784.0)     sinus/ migraine headaches  . Uterine polyp   . PONV (postoperative nausea and vomiting)   . Chronic rectal pain   . Wears contact lenses     Past Surgical History  Procedure Laterality Date  . Evaluation under anesthesia with hemorrhoidectomy N/A 11/15/2012    Procedure: EXAM UNDER ANESTHESIA WITH  HEMORRHOIDECTOMY;  Surgeon: Romie Levee, MD;  Location: [facility];  Service: General;  Laterality: N/A;  . Hysteroscopy N/A 05/14/2013    Procedure: HYSTEROSCOPY POLYPECTOMY  DILITATIONAND CURETTAGE, INTRAOP HYSTEROSALPINGOGRAM ;  Surgeon: Fermin Schwab, MD;  Location: [facility];  Service: Gynecology;  Laterality: N/A;    Family History  Problem Relation Age of Onset  . Bladder Cancer Father   . Cancer Maternal Grandfather   . Cancer Paternal Grandfather     Social History  Substance Use Topics  . Smoking status: Never Smoker   . Smokeless tobacco: Never Used  . Alcohol Use: No     Comment: socially    Allergies:  Allergies  Allergen Reactions  . Demerol  [Meperidine] Other (See Comments)    halluninations    Prescriptions prior to admission  Medication Sig Dispense Refill Last Dose  . calcium carbonate (TUMS - DOSED IN MG ELEMENTAL CALCIUM) 500 MG chewable tablet Chew 1 tablet by mouth daily.   Past Week at Unknown time  . Prenatal Vit-Fe Fumarate-FA (PRENATAL MULTIVITAMIN) TABS tablet Take 1 tablet by mouth daily at 12 noon.   01/29/2015 at Unknown time  . sertraline (ZOLOFT) 25 MG tablet Take 1 tablet (25 mg total) by mouth daily. (Patient not taking: Reported on 07/08/2014) 30 tablet 0 Not Taking at Unknown time   No results found for this or any previous visit (from the past 48 hour(s)).  Review of Systems  Constitutional: Negative for fever and chills.  Gastrointestinal: Negative for abdominal pain.  Genitourinary: Negative for dysuria and urgency.   Physical Exam   Blood pressure 91/54, pulse 90, temperature 98.5 F (36.9 C), temperature source Oral, resp. rate 18, last menstrual period 06/17/2014.  Physical Exam  Constitutional: She is oriented to person, place, and time. She appears well-developed and well-nourished. No distress.  HENT:  Head: Normocephalic.  Eyes: Pupils are equal, round, and reactive to light.  GI: Soft. She exhibits no distension. There is no tenderness. There is no rebound.  Genitourinary:  Dilation: Closed Cervical Position: Posterior Station:  (high)  Exam by:: J Rasch NP  Neurological: She is alert and oriented to person, place, and time.  Skin: Skin is warm. She is not diaphoretic.  Psychiatric: She is slowed.    Fetal Tracing: Baseline: 125 bpm  Variability: Moderate  Accelerations: 15x15 Decelerations: None Toco: quiet   MAU Course  Procedures  None  MDM  Discussed patient with Dr. Henderson CloudHorvath @ 1258; fetal tracing reactive, good variability with 15x15 accelerations. Ok to discharge home per Dr. Henderson CloudHorvath.   Assessment and Plan   A:  1. Decreased fetal movement, third trimester, not  applicable or unspecified fetus   2. NST (non-stress test) reactive    P:  Discharge home in stable condition Fetal kick counts reviewed Return to MAU if symptoms worsen Follow up with OB as scheduled   Duane LopeJennifer I Rasch, NP 01/30/2015 6:23 PM

## 2015-01-30 NOTE — MAU Note (Addendum)
C/o decreased fetal movement in past 2 days; pt was told by OB's office to come to MAU yesterday for NST; have some pelvic pressure and concerned that she is not feeling the "baby move as much" as usual

## 2015-02-14 NOTE — L&D Delivery Note (Signed)
Delivery Note At 6:42 PM a viable and healthy female was delivered via  (Presentation: Left Occiput ; Anterior ).  APGAR: 8, 9; weight pending .   Placenta status: spontaneous , intact.  Cord:3V  With a loose nuchal x 1  Anesthesia: Epidural  Episiotomy:  None Lacerations:  2nd Suture Repair: 3.0 vicryl Est. Blood Loss (mL):  300cc  Mom to postpartum.  Baby to Couplet care / Skin to Skin.  Eveleigh Crumpler H. 03/31/2015, 7:09 PM

## 2015-02-25 LAB — OB RESULTS CONSOLE GBS: GBS: NEGATIVE

## 2015-03-26 ENCOUNTER — Telehealth (HOSPITAL_COMMUNITY): Payer: Self-pay | Admitting: *Deleted

## 2015-03-26 ENCOUNTER — Encounter (HOSPITAL_COMMUNITY): Payer: Self-pay | Admitting: *Deleted

## 2015-03-26 ENCOUNTER — Other Ambulatory Visit: Payer: Self-pay | Admitting: Obstetrics and Gynecology

## 2015-03-26 NOTE — Telephone Encounter (Signed)
Preadmission screen  

## 2015-03-30 ENCOUNTER — Inpatient Hospital Stay (HOSPITAL_COMMUNITY): Payer: BLUE CROSS/BLUE SHIELD

## 2015-03-31 ENCOUNTER — Inpatient Hospital Stay (HOSPITAL_COMMUNITY)
Admission: RE | Admit: 2015-03-31 | Discharge: 2015-04-02 | DRG: 775 | Disposition: A | Discharge status: Home / Self Care | Payer: BLUE CROSS/BLUE SHIELD | Source: Ambulatory Visit | Attending: Obstetrics and Gynecology | Admitting: Obstetrics and Gynecology

## 2015-03-31 ENCOUNTER — Inpatient Hospital Stay (HOSPITAL_COMMUNITY): Payer: BLUE CROSS/BLUE SHIELD | Admitting: Anesthesiology

## 2015-03-31 ENCOUNTER — Encounter (HOSPITAL_COMMUNITY): Payer: Self-pay

## 2015-03-31 DIAGNOSIS — Z6791 Unspecified blood type, Rh negative: Secondary | ICD-10-CM | POA: Diagnosis not present

## 2015-03-31 DIAGNOSIS — Z3A41 41 weeks gestation of pregnancy: Secondary | ICD-10-CM

## 2015-03-31 DIAGNOSIS — O26893 Other specified pregnancy related conditions, third trimester: Secondary | ICD-10-CM | POA: Diagnosis present

## 2015-03-31 DIAGNOSIS — Z3490 Encounter for supervision of normal pregnancy, unspecified, unspecified trimester: Secondary | ICD-10-CM

## 2015-03-31 DIAGNOSIS — O480 Post-term pregnancy: Principal | ICD-10-CM | POA: Diagnosis present

## 2015-03-31 LAB — CBC
HCT: 33.8 % — ABNORMAL LOW (ref 36.0–46.0)
HEMOGLOBIN: 11.5 g/dL — AB (ref 12.0–15.0)
MCH: 31.7 pg (ref 26.0–34.0)
MCHC: 34 g/dL (ref 30.0–36.0)
MCV: 93.1 fL (ref 78.0–100.0)
PLATELETS: 242 10*3/uL (ref 150–400)
RBC: 3.63 MIL/uL — ABNORMAL LOW (ref 3.87–5.11)
RDW: 13.8 % (ref 11.5–15.5)
WBC: 7.5 10*3/uL (ref 4.0–10.5)

## 2015-03-31 LAB — OB RESULTS CONSOLE ABO/RH: RH Type: NEGATIVE

## 2015-03-31 LAB — RPR: RPR: NONREACTIVE

## 2015-03-31 MED ORDER — DIPHENHYDRAMINE HCL 50 MG/ML IJ SOLN: 12.5000 mg | INTRAMUSCULAR | Status: DC | PRN

## 2015-03-31 MED ORDER — DIPHENHYDRAMINE HCL 25 MG PO CAPS: 25.0000 mg | ORAL_CAPSULE | Freq: Four times a day (QID) | ORAL | Status: DC | PRN

## 2015-03-31 MED ORDER — LACTATED RINGERS IV SOLN: 500.0000 mL | INTRAVENOUS | Status: DC | PRN

## 2015-03-31 MED ORDER — LACTATED RINGERS IV SOLN
1.0000 m[IU]/min | INTRAVENOUS | Status: DC
  Filled 2015-03-31: qty 4

## 2015-03-31 MED ORDER — TERBUTALINE SULFATE 1 MG/ML IJ SOLN
0.2500 mg | Freq: Once | INTRAMUSCULAR | Status: DC | PRN
  Filled 2015-03-31: qty 1

## 2015-03-31 MED ORDER — FENTANYL 2.5 MCG/ML BUPIVACAINE 1/10 % EPIDURAL INFUSION (WH - ANES)
14.0000 mL/h | INTRAMUSCULAR | Status: DC | PRN
  Administered 2015-03-31: 14 mL/h via EPIDURAL
  Filled 2015-03-31: qty 125

## 2015-03-31 MED ORDER — LACTATED RINGERS IV SOLN
INTRAVENOUS | Status: DC
  Administered 2015-03-31 (×2): via INTRAVENOUS

## 2015-03-31 MED ORDER — PHENYLEPHRINE 40 MCG/ML (10ML) SYRINGE FOR IV PUSH (FOR BLOOD PRESSURE SUPPORT)
80.0000 ug | PREFILLED_SYRINGE | INTRAVENOUS | Status: DC | PRN
  Administered 2015-03-31 (×2): 80 ug via INTRAVENOUS
  Filled 2015-03-31: qty 2

## 2015-03-31 MED ORDER — ONDANSETRON HCL 4 MG PO TABS
4.0000 mg | ORAL_TABLET | ORAL | Status: DC | PRN
Start: 2015-03-31 — End: 2015-04-02

## 2015-03-31 MED ORDER — PRENATAL MULTIVITAMIN CH
1.0000 | ORAL_TABLET | Freq: Every day | ORAL | Status: DC
  Administered 2015-04-01 – 2015-04-02 (×2): 1 via ORAL
  Filled 2015-03-31 (×2): qty 1

## 2015-03-31 MED ORDER — ACETAMINOPHEN 325 MG PO TABS
650.0000 mg | ORAL_TABLET | ORAL | Status: DC | PRN
  Administered 2015-04-01 (×2): 650 mg via ORAL
  Filled 2015-03-31 (×2): qty 2

## 2015-03-31 MED ORDER — OXYCODONE-ACETAMINOPHEN 5-325 MG PO TABS: 1.0000 | ORAL_TABLET | ORAL | Status: DC | PRN

## 2015-03-31 MED ORDER — FLEET ENEMA 7-19 GM/118ML RE ENEM: 1.0000 | ENEMA | RECTAL | Status: DC | PRN

## 2015-03-31 MED ORDER — ZOLPIDEM TARTRATE 5 MG PO TABS: 5.0000 mg | ORAL_TABLET | Freq: Every evening | ORAL | Status: DC | PRN

## 2015-03-31 MED ORDER — SIMETHICONE 80 MG PO CHEW: 80.0000 mg | CHEWABLE_TABLET | ORAL | Status: DC | PRN

## 2015-03-31 MED ORDER — ONDANSETRON HCL 4 MG/2ML IJ SOLN: 4.0000 mg | Freq: Four times a day (QID) | INTRAMUSCULAR | Status: DC | PRN

## 2015-03-31 MED ORDER — SENNOSIDES-DOCUSATE SODIUM 8.6-50 MG PO TABS
2.0000 | ORAL_TABLET | ORAL | Status: DC
  Administered 2015-03-31 – 2015-04-02 (×2): 2 via ORAL
  Filled 2015-03-31 (×2): qty 2

## 2015-03-31 MED ORDER — WITCH HAZEL-GLYCERIN EX PADS
1.0000 "application " | MEDICATED_PAD | CUTANEOUS | Status: DC | PRN
  Administered 2015-03-31: 1 via TOPICAL

## 2015-03-31 MED ORDER — LANOLIN HYDROUS EX OINT: TOPICAL_OINTMENT | CUTANEOUS | Status: DC | PRN

## 2015-03-31 MED ORDER — METHYLERGONOVINE MALEATE 0.2 MG PO TABS: 0.2000 mg | ORAL_TABLET | ORAL | Status: DC | PRN

## 2015-03-31 MED ORDER — OXYCODONE-ACETAMINOPHEN 5-325 MG PO TABS: 2.0000 | ORAL_TABLET | ORAL | Status: DC | PRN

## 2015-03-31 MED ORDER — LIDOCAINE HCL (PF) 1 % IJ SOLN
30.0000 mL | INTRAMUSCULAR | Status: DC | PRN
  Filled 2015-03-31: qty 30

## 2015-03-31 MED ORDER — ACETAMINOPHEN 325 MG PO TABS: 650.0000 mg | ORAL_TABLET | ORAL | Status: DC | PRN

## 2015-03-31 MED ORDER — BENZOCAINE-MENTHOL 20-0.5 % EX AERO
1.0000 "application " | INHALATION_SPRAY | CUTANEOUS | Status: DC | PRN
  Administered 2015-03-31: 1 via TOPICAL
  Filled 2015-03-31: qty 56

## 2015-03-31 MED ORDER — EPHEDRINE 5 MG/ML INJ
10.0000 mg | INTRAVENOUS | Status: DC | PRN
  Filled 2015-03-31: qty 2

## 2015-03-31 MED ORDER — DIBUCAINE 1 % RE OINT
1.0000 "application " | TOPICAL_OINTMENT | RECTAL | Status: DC | PRN
  Administered 2015-03-31: 1 via RECTAL
  Filled 2015-03-31: qty 28

## 2015-03-31 MED ORDER — TETANUS-DIPHTH-ACELL PERTUSSIS 5-2.5-18.5 LF-MCG/0.5 IM SUSP: 0.5000 mL | Freq: Once | INTRAMUSCULAR | Status: DC

## 2015-03-31 MED ORDER — ONDANSETRON HCL 4 MG/2ML IJ SOLN: 4.0000 mg | INTRAMUSCULAR | Status: DC | PRN

## 2015-03-31 MED ORDER — BUTORPHANOL TARTRATE 1 MG/ML IJ SOLN
1.0000 mg | INTRAMUSCULAR | Status: DC | PRN
  Administered 2015-03-31: 1 mg via INTRAVENOUS
  Filled 2015-03-31: qty 1

## 2015-03-31 MED ORDER — OXYTOCIN BOLUS FROM INFUSION
500.0000 mL | INTRAVENOUS | Status: DC
  Administered 2015-03-31: 500 mL via INTRAVENOUS

## 2015-03-31 MED ORDER — LIDOCAINE HCL (PF) 1 % IJ SOLN
INTRAMUSCULAR | Status: DC | PRN
  Administered 2015-03-31: 6 mL via EPIDURAL
  Administered 2015-03-31: 8 mL via EPIDURAL

## 2015-03-31 MED ORDER — OXYTOCIN 10 UNIT/ML IJ SOLN
2.5000 [IU]/h | INTRAVENOUS | Status: DC
  Administered 2015-03-31: 2.5 [IU]/h via INTRAVENOUS

## 2015-03-31 MED ORDER — PHENYLEPHRINE 40 MCG/ML (10ML) SYRINGE FOR IV PUSH (FOR BLOOD PRESSURE SUPPORT)
80.0000 ug | PREFILLED_SYRINGE | INTRAVENOUS | Status: DC | PRN
  Filled 2015-03-31: qty 20
  Filled 2015-03-31: qty 2

## 2015-03-31 MED ORDER — CITRIC ACID-SODIUM CITRATE 334-500 MG/5ML PO SOLN
30.0000 mL | ORAL | Status: DC | PRN
Start: 2015-03-31 — End: 2015-03-31

## 2015-03-31 MED ORDER — LACTATED RINGERS IV SOLN: 500.0000 mL | Freq: Once | INTRAVENOUS | Status: DC

## 2015-03-31 MED ORDER — METHYLERGONOVINE MALEATE 0.2 MG/ML IJ SOLN: 0.2000 mg | INTRAMUSCULAR | Status: DC | PRN

## 2015-03-31 MED ORDER — SODIUM BICARBONATE 8.4 % IV SOLN
INTRAVENOUS | Status: DC | PRN
  Administered 2015-03-31: 5 mL via EPIDURAL

## 2015-03-31 MED ORDER — IBUPROFEN 600 MG PO TABS
600.0000 mg | ORAL_TABLET | Freq: Four times a day (QID) | ORAL | Status: DC
  Administered 2015-03-31 – 2015-04-02 (×7): 600 mg via ORAL
  Filled 2015-03-31 (×7): qty 1

## 2015-03-31 MED ORDER — OXYTOCIN 10 UNIT/ML IJ SOLN
1.0000 m[IU]/min | INTRAMUSCULAR | Status: DC
  Administered 2015-03-31: 2 m[IU]/min via INTRAVENOUS

## 2015-03-31 NOTE — Anesthesia Procedure Notes (Addendum)
Epidural Patient location during procedure: OB Start time: 03/31/2015 12:45 PM End time: 03/31/2015 12:49 PM  Staffing Anesthesiologist: Leilani Able Performed by: anesthesiologist   Preanesthetic Checklist Completed: patient identified, surgical consent, pre-op evaluation, timeout performed, IV checked, risks and benefits discussed and monitors and equipment checked  Epidural Patient position: sitting Prep: site prepped and draped and DuraPrep Patient monitoring: continuous pulse ox and blood pressure Approach: midline Location: L3-L4 Injection technique: LOR air  Needle:  Needle type: Tuohy  Needle gauge: 17 G Needle length: 9 cm and 9 Needle insertion depth: 5 cm cm Catheter type: closed end flexible Catheter size: 19 Gauge Catheter at skin depth: 10 cm Test dose: negative and Other  Assessment Sensory level: T9 Events: blood not aspirated, injection not painful, no injection resistance, negative IV test and no paresthesia  Additional Notes Reason for block:procedure for pain

## 2015-03-31 NOTE — H&P (Signed)
Judith Jackson is a 34 y.o. female presenting for IOL for postterm pregnancy  35 yo G1P0 @ 41+2 presents for post-term IOL. Her pregnancy has been complicated by AMA. NIPT low risk female History OB History    Gravida Para Term Preterm AB TAB SAB Ectopic Multiple Living   1              Past Medical History  Diagnosis Date  . Anxiety   . Headache(784.0)     sinus/ migraine headaches  . Uterine polyp   . PONV (postoperative nausea and vomiting)   . Chronic rectal pain   . Wears contact lenses   . Hx of varicella    Past Surgical History  Procedure Laterality Date  . Evaluation under anesthesia with hemorrhoidectomy N/A 11/15/2012    Procedure: EXAM UNDER ANESTHESIA WITH  HEMORRHOIDECTOMY;  Surgeon: Romie Levee, MD;  Location: [facility];  Service: General;  Laterality: N/A;  . Hysteroscopy N/A 05/14/2013    Procedure: HYSTEROSCOPY POLYPECTOMY  DILITATIONAND CURETTAGE, INTRAOP HYSTEROSALPINGOGRAM ;  Surgeon: Fermin Schwab, MD;  Location: [facility];  Service: Gynecology;  Laterality: N/A;   Family History: family history includes Bladder Cancer in her father; Cancer in her father, maternal grandfather, and paternal grandfather. Social History:  reports that she has never smoked. She has never used smokeless tobacco. She reports that she does not drink alcohol or use illicit drugs.   Prenatal Transfer Tool  Maternal Diabetes: No Genetic Screening: Normal Maternal Ultrasounds/Referrals: Normal Fetal Ultrasounds or other Referrals:  None Maternal Substance Abuse:  No Significant Maternal Medications:  None Significant Maternal Lab Results:  None Other Comments:  None  ROS: as above    Blood pressure 117/83, pulse 76, temperature 98.4 F (36.9 C), temperature source Oral, height  (1.575 m), weight 78.019 kg (172 lb), last menstrual period 06/17/2014. Exam Physical Exam  Prenatal labs: ABO, Rh: O/Positive/-- (07/26 0000) Antibody: Negative  (07/26 0000) Rubella: Immune (07/26 0000) RPR: Nonreactive (07/26 0000)  HBsAg: Negative (07/26 0000)  HIV: Non-reactive (07/26 0000)  GBS: Negative (01/12 0000)   Assessment/Plan: 1) Admit 2) Pitocin, AROM when able 3) Epidural on request   Faysal Fenoglio H. 03/31/2015, 8:11 AM

## 2015-03-31 NOTE — Anesthesia Preprocedure Evaluation (Signed)
Anesthesia Evaluation  Patient identified by MRN, date of birth, ID band Patient awake    Reviewed: Allergy & Precautions, H&P , NPO status , Patient's Chart, lab work & pertinent test results  Airway Mallampati: I  TM Distance: >3 FB Neck ROM: full    Dental no notable dental hx.    Pulmonary neg pulmonary ROS,    Pulmonary exam normal        Cardiovascular negative cardio ROS Normal cardiovascular exam     Neuro/Psych negative psych ROS   GI/Hepatic negative GI ROS, Neg liver ROS,   Endo/Other  negative endocrine ROS  Renal/GU negative Renal ROS     Musculoskeletal   Abdominal Normal abdominal exam  (+)   Peds  Hematology negative hematology ROS (+)   Anesthesia Other Findings   Reproductive/Obstetrics (+) Pregnancy                             Anesthesia Physical Anesthesia Plan  ASA: II  Anesthesia Plan: Epidural   Post-op Pain Management:    Induction:   Airway Management Planned:   Additional Equipment:   Intra-op Plan:   Post-operative Plan:   Informed Consent: I have reviewed the patients History and Physical, chart, labs and discussed the procedure including the risks, benefits and alternatives for the proposed anesthesia with the patient or authorized representative who has indicated his/her understanding and acceptance.     Plan Discussed with:   Anesthesia Plan Comments:         Anesthesia Quick Evaluation  

## 2015-03-31 NOTE — Consults (Signed)
  Anesthesia Pain Consult Note  Patient: Judith Jackson, 35 y.o., female  Consult Requested by: Carrington Clamp, MD  Reason for Consult: Pain rounds  Level of Consciousness: alert  Pain: 1 /10  Expected pain level 3-4.

## 2015-03-31 NOTE — Progress Notes (Signed)
Patient ID: Judith Jackson, female   DOB: 1980/06/13, 35 y.o.   MRN: [id number]  S: Feeling some more strength with her contractions O:  Filed Vitals:   03/31/15 0900 03/31/15 0930 03/31/15 1001 03/31/15 1030  BP: 130/86 120/78 110/71 120/66  Pulse: 70 54 69 45  Temp:      TempSrc:      Resp: Height:      Weight:       AOx3, NAD Cvx 4/90/-2 FHR 140 reactive, category 1 tracing toco Q2  AROM clear blood tinged fluid  A/P 1) Continue pit 2) IV pain medications and epidural on request

## 2015-04-01 LAB — CBC
HCT: 30.6 % — ABNORMAL LOW (ref 36.0–46.0)
HEMOGLOBIN: 10.2 g/dL — AB (ref 12.0–15.0)
MCH: 31.3 pg (ref 26.0–34.0)
MCHC: 33.3 g/dL (ref 30.0–36.0)
MCV: 93.9 fL (ref 78.0–100.0)
Platelets: 209 10*3/uL (ref 150–400)
RBC: 3.26 MIL/uL — AB (ref 3.87–5.11)
RDW: 14 % (ref 11.5–15.5)
WBC: 15.2 10*3/uL — ABNORMAL HIGH (ref 4.0–10.5)

## 2015-04-01 MED ORDER — RHO D IMMUNE GLOBULIN 1500 UNIT/2ML IJ SOSY
300.0000 ug | PREFILLED_SYRINGE | Freq: Once | INTRAMUSCULAR | Status: AC
  Administered 2015-04-01: 300 ug via INTRAVENOUS
  Filled 2015-04-01: qty 2

## 2015-04-01 MED ORDER — OXYCODONE HCL 5 MG PO TABS
5.0000 mg | ORAL_TABLET | Freq: Four times a day (QID) | ORAL | Status: DC | PRN
  Administered 2015-04-01: 5 mg via ORAL
  Filled 2015-04-01: qty 1

## 2015-04-01 NOTE — Lactation Note (Signed)
This note was copied from a baby's chart. Lactation Consultation Note New mom stated she was breast/formula on admission. States she really doesn't want to BF. If she had her way she wouldn't BF. I explained it is her way, she chooses how she wants to feed her baby. Mom stated she knew it was good for the baby and everyone says she should BF so she will try.  Mom stated she didn't have anything for baby to drink and felt like he was hungry. Baby was sleeping soundly. Noted recessed chin. Mom states he doesn't seem to like it because he pushes away. Mom repeatedly saying I really don't want to BF. I asked mom if she would like to pump and bottle. She stated no, its much easier just to give him formula in a bottle. She knew she wouldn't be committed to pumping on a regular basis. She had prefer to put formula in a bottle. I encouraged mom to call for assistance if she wants to latch baby for next feeding. Mom encouraged to feed baby 8-12 times/24 hours and with feeding cues. Educated about newborn behavior, STS, I&O, supply and demand. At this time mom didn't want me to assess her breast or teach her how to express colostrum. Stated she would call. Referred to Baby and Me Book in Breastfeeding section Pg. 22-23 for position options and Proper latch demonstration. WH/LC brochure given w/resources, support groups and LC services. Mom had a worried look on her face. Encouraged mom to rest while baby was resting and to relax, not to stress over what to do. Baby has latched on, mom doesn't really like BF. I personally do not think mom will, she feels like she is being pressured. I assured her that it was her decision, she knew the benefits of BF.  Patient Name: Judith Jackson [id number] Date: 04/01/2015 Reason for consult: Initial assessment   Maternal Data Has patient been taught Hand Expression?: Yes Does the patient have breastfeeding experience prior to this delivery?: No  Feeding    LATCH  Score/Interventions                Intervention(s): Breastfeeding basics reviewed;Support Pillows;Position options;Skin to skin     Lactation Tools Discussed/Used WIC Program: Yes   Consult Status Consult Status: Follow-up Date: 04/01/15 Follow-up type: In-patient    Charyl Dancer 04/01/2015, 4:22 AM

## 2015-04-01 NOTE — Progress Notes (Signed)
Patient is doing well.  She is ambulating, voiding, tolerating PO.  Pain control is good.  Lochia is appropriate  Filed Vitals:   03/31/15 2140 03/31/15 2243 04/01/15 0300 04/01/15 0545  BP: 117/53 125/65 117/79 126/72  Pulse: 59 79 55 60  Temp: 98.1 F (36.7 C) 98.8 F (37.1 C) 97.9 F (36.6 C) 98 F (36.7 C)  TempSrc: Oral Oral Oral Oral  Resp: Height:      Weight:      SpO2:        NAD Fundus firm Ext: no edema  Lab Results  Component Value Date   WBC 15.2* 04/01/2015   HGB 10.2* 04/01/2015   HCT 30.6* 04/01/2015   MCV 93.9 04/01/2015   PLT 209 04/01/2015    --/--/O NEG (02/15 0750)/Rimmune  A/P 34 y.o. G1P1001 PPD#1. Routine care.   Rh neg--baby Rh +, rhogam studies pending Desires circ--baby has voided.  Will circ after baby seen by peds Expect d/c tomorrow.    [facility] GEFFEL [employer]

## 2015-04-01 NOTE — Anesthesia Postprocedure Evaluation (Signed)
Anesthesia Post Note  Patient: Judith Jackson  Procedure(s) Performed: * No procedures listed *  Patient location during evaluation: Mother Baby Anesthesia Type: Epidural Level of consciousness: awake and alert and oriented Pain management: satisfactory to patient (Patient stated that epidural did not work well for laborl) Vital Signs Assessment: post-procedure vital signs reviewed and stable Respiratory status: nonlabored ventilation, spontaneous breathing and respiratory function stable Cardiovascular status: stable Postop Assessment: no headache, no backache, patient able to bend at knees, no signs of nausea or vomiting and adequate PO intake Anesthetic complications: no    Last Vitals:  Filed Vitals:   04/01/15 0300 04/01/15 0545  BP: 117/79 126/72  Pulse: 55 60  Temp: 36.6 C 36.7 C  Resp: 18 18    Last Pain:  Filed Vitals:   04/01/15 0556  PainSc: 4                  Tarvis Blossom
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
## 2015-04-02 LAB — RH IG WORKUP (INCLUDES ABO/RH)
ABO/RH(D): O NEG
Fetal Screen: NEGATIVE
GESTATIONAL AGE(WKS): 41.2
Unit division: 0

## 2015-04-02 NOTE — Lactation Note (Signed)
This note was copied from a baby's chart. Lactation Consultation Note  Patient Name: Judith Jackson ZOXWR'U Date: 04/02/2015 Reason for consult: Follow-up assessment;Other (Comment) (baby post circ and sleeping and mom plans to take a shower and call LC back )   Maternal Data    Feeding    LATCH Score/Interventions                Intervention(s): Breastfeeding basics reviewed     Lactation Tools Discussed/Used     Consult Status Consult Status: Follow-up Date: 04/02/15 Follow-up type: In-patient    Kathrin Greathouse 04/02/2015, 11:58 AM

## 2015-04-02 NOTE — Lactation Note (Signed)
This note was copied from a baby's chart. Lactation Consultation Note  Patient Name: Boy Marlaine Arey RUEAV'W Date: 04/02/2015   Making rounds and mother had questions related to breastfeeding. She is worried because her baby is not latching well to the breast. Baby is having a circ this am. Mother has been assisted by lactation consultants several times. Mother is feeling tired and overwhelmed with concerns about  her baby eating and getting enough. She reports the only time he was content was after formula feeding.  Reviewed hand expression and mother returned demonstration. Scant colostrum noted. Reviewed application of a nipple shield and mother returned demonstration. Demonstrated how to add EBM/ formula into shield to satisfy baby and mother verbalized understanding.  Mother had several questions related to pumping, storage, cleaning tubing, use of bottles, herbs to increase milk supply. Husband present and very supportive. Parents were given instructions for questions and handout for Fenugreek and Moringa as requested. Discussed the benefits of an outpatient appointment with lactation. Once baby returns from circ, and showing feeding cues, mother to call for [facility] for assist. Mother plans to pump and give breast milk as an option if breastfeeding does not work for her.   Maternal Data    Feeding    LATCH Score/Interventions                      Lactation Tools Discussed/Used     Consult Status      Christella Hartigan M 04/02/2015, 11:05 AM

## 2015-04-02 NOTE — Lactation Note (Signed)
This note was copied from a baby's chart. Lactation Consultation Note  Patient Name: Judith Jackson ZOXWR'U Date: 04/02/2015 Reason for consult: Follow-up assessment;Difficult latch;Other (Comment) (mom also pumping and bottle feeding , see LC note )  Baby was 42 hours at the start of consult , has been sluggish today , post circ. LC offered assist with latching at the breast , mom declined and  Requested to be shown how to feed the baby with a bottle. LC demo pace feeding and baby took 20 ml sluggishly at 1st and then picked up and tolerated well without spitting.  LC recommended to to indress the baby by 2 1/2 - 3 hours , place skin to skin.  LC reviewed sore nipple and engorgement prevention and tx , storage of breast milk , referring to the baby and me booklet pages 24 -25.  Watched mom use the DEBP , and increased flanges to #27 , at the base tight , increased to #30 for home .  Per mom has a DEBP at  Home.  Offered mom and LC O/P appt. And mom declined.     Maternal Data    Feeding    LATCH Score/Interventions                Intervention(s): Breastfeeding basics reviewed     Lactation Tools Discussed/Used Tools: Flanges Flange Size: 30 Breast pump type: Double-Electric Breast Pump Pump Review: Setup, frequency, and cleaning;Milk Storage Initiated by:: Reviewed - MAI  Date initiated:: 04/02/15   Consult Status Consult Status: Complete Date: 04/02/15 Follow-up type: In-patient    Kathrin Greathouse 04/02/2015, 2:22 PM

## 2015-04-02 NOTE — Lactation Note (Addendum)
This note was copied from a baby's chart. Lactation Consultation Note Mom having trouble latching w/wo NS. Fitted #20NS, #24 appears to be large at this moment. Mom has very soft breast pendulum breast that must be compress to firm up breast tissue to firm nipple up in order to latch. Baby has a very recessed chin which keeps getting tucked under NS, needing to be pulled out. Mom isn't able to pull chin out at this time. W/curve tip syring inserted formula into NS to encourage baby to suckle on NS. Took only a few sucks. Encouraged mom to have patients. Mom has anxiety Hx: I feel that she is experencing anxiety or doubt now d/t behavior and flat affect, mom stares at baby expressionless which concerns me. I asked mom if she was in love yet, (I ask a lot of moms this and they usually smile and say oh yes!) this mom said yes and lightly smiled. She is overwhelmed and stills continue to question what way she wants to feed baby. Options are breast feeding, pump and bottle feed, or to just give formula. Does she have anything to give him? is he starving? What way is best for him? What way is easier for her? Do you think he doesn't like it or want it? Why does he go to sleep everytime I try to put him to breast? Newborn behavior reviewed again. Mom has flat affect, doesn't know what she wants. Baby is quiet, isn't acting like he is starving, mom was holding him looking at him when entered rm. Baby was awake looking at mom quietly, not acting distressed or hungry. Has had 3 voids and 6 stools in 30 hrs of life. Explained to mom she needs to pump every three hours to stimulate her breast to help her mature milk. Explained to mom its normal not to get anything when pumping d/t colostrum is very thick. (LC is unable to see colostrum w/hand expression). RN said she wasn't able to hand express anything either. NS appears a little crusty looking on inside, so that was encouraging, was explained to mom that was a sign the baby  is getting something. Important to document I&O. Reminded of output in Baby and Me book to show out put in days of age. At this time mom is wanting to supplement because she feels like he is starving. Encouraged to put baby to breast first before bottle feeding if she plans on BF. Mom is breast/formula. Baby has had 6% weight loss and LC UNABLE to hand express colostrum to feed baby.  Mom came in w/concept that she didn't want to BF and that she was doing it because others wanted her to because it was good for the baby. Mom appears to have a lot of self doubt, I am not sure if she is looking for an excuse not to BF or if she is just so anxious and overwhelmed she can't relax, hear what staff is telling her, encouraging her w/o pressure, to enjoy her baby, and go with what her heart and true feelings are, and do what she wants and not what others want. Encouraged to relax, that would help her milk come in fast. I did stress the importance of pumping to stimulate milk production.  I DON'T FEEL LIKE MOM IS READY FOR DISCHARGE HOME WITH BABY AT THIS TIME D/T FEEDING ISSUES.  Patient Name: Judith Jackson ZOXWR'U Date: 04/02/2015 Reason for consult: Follow-up assessment;Difficult latch   Maternal Data    Feeding  Feeding Type: Formula Length of feed: 0 min  LATCH Score/Interventions Latch: Too sleepy or reluctant, no latch achieved, no sucking elicited. Intervention(s): Skin to skin;Teach feeding cues;Waking techniques Intervention(s): Adjust position;Assist with latch;Breast massage;Breast compression  Audible Swallowing: None Intervention(s): Skin to skin  Type of Nipple: Flat (everts after stimulation then flattens back ) Intervention(s): Shells;Double electric pump  Comfort (Breast/Nipple): Soft / non-tender     Hold (Positioning): Full assist, staff holds infant at breast Intervention(s): Breastfeeding basics reviewed;Position options;Skin to skin  LATCH Score: 3  Lactation Tools  Discussed/Used Tools: Shells;Nipple Dorris Carnes;Pump Nipple shield size: 20 Shell Type: Inverted Breast pump type: Double-Electric Breast Pump Pump Review: Setup, frequency, and cleaning;Milk Storage Initiated by:: RN Date initiated:: 04/01/15   Consult Status Consult Status: Follow-up Date: 04/02/15 Follow-up type: In-patient    Charyl Dancer 04/02/2015, 1:58 AM

## 2015-04-02 NOTE — Lactation Note (Signed)
This note was copied from a baby's chart. Lactation Consultation Note  Patient Name: Judith Jackson ZOXWR'U Date: 04/02/2015  baby has been sleepy today and per mom has been in a lot of discomfort and bottom pain making it difficult to  Sit up. Presently mom resting on her side . Recently the [facility] RN had been assisting mom with latching with and without a nipple shield #20.  MBURN reported she was unable to latch the baby. LC followed up with mom and baby and after several attempts with trying to latch with and without the NS  Was able to latch with a few intermittent swallows and stimulation, depth obtained. Per mom having a little discomfort when abby sucking, but when asked if she wanted  The LC to release the suction, mom declined and said it's not all the time. Baby released on his own, nipple well rounded.  Company arrived with curtain drawn and mom requested to wait for latching on the other breast until after the company left.  LC went back to check on mom 45 mins later and baby sound asleep.  LC reviewed basics of latching during the consult , hand expressing , sandwiching the breast tissue to latch with breast compressions with latch and intermittent.  LC reassured mom MBU RN or LC would be able to assist with a latching. Encouraged to call on the nurses light.  LC suspects baby may have it's nights and days switched around.    Maternal Data    Feeding    LATCH Score/Interventions                      Lactation Tools Discussed/Used     Consult Status      Kathrin Greathouse 04/02/2015, 11:40 AM

## 2015-04-02 NOTE — Discharge Summary (Signed)
Obstetric Discharge Summary Reason for Admission: induction of labor Prenatal Procedures: ultrasound Intrapartum Procedures: spontaneous vaginal delivery Postpartum Procedures: none Complications-Operative and Postpartum: 2nd degree perineal laceration HEMOGLOBIN  Date Value Ref Range Status  04/01/2015 10.2* 12.0 - 15.0 g/dL Final   HCT  Date Value Ref Range Status  04/01/2015 30.6* 36.0 - 46.0 % Final    Physical Exam:  General: alert and cooperative Lochia: appropriate Uterine Fundus: firm DVT Evaluation: No evidence of DVT seen on physical exam.  Discharge Diagnoses: Term Pregnancy-delivered  Discharge Information: Date: 04/02/2015 Activity: pelvic rest Diet: routine Medications: PNV and Ibuprofen Condition: stable Instructions: refer to practice specific booklet Discharge to: home Follow-up Information    Follow up with Almon Hercules., MD In 4 weeks.   Specialty:  Obstetrics and Gynecology   Contact information:   [address] [place] [address] [phone]       Newborn Data: Live born female  Birth Weight: 8 lb 9.6 oz (3901 g) APGAR: 9, 9  Home with mother.  Philip Aspen 04/02/2015, 11:33 AM

## 2015-04-04 ENCOUNTER — Telehealth (HOSPITAL_COMMUNITY): Payer: Self-pay

## 2015-04-04 LAB — TYPE AND SCREEN
ABO/RH(D): O NEG
Antibody Screen: POSITIVE
DAT, IgG: NEGATIVE
UNIT DIVISION: 0

## 2015-04-04 NOTE — Telephone Encounter (Signed)
Mom has questions about giving baby her expressed breastmilk while she is sick with cold symptoms. Baby is 2 days old and mom is just beginning to express drops.  Advised mom to give to baby and supplement with formula as needed as she has already been doing.  Discussed pumping plan and engorgement care.  Mom to call back with any additional concerns.

## 2015-07-26 ENCOUNTER — Ambulatory Visit (HOSPITAL_COMMUNITY)
Admission: EM | Admit: 2015-07-26 | Discharge: 2015-07-26 | Disposition: A | Discharge status: Home / Self Care | Payer: BLUE CROSS/BLUE SHIELD | Attending: Family Medicine | Admitting: Family Medicine

## 2015-07-26 ENCOUNTER — Encounter (HOSPITAL_COMMUNITY): Payer: Self-pay | Admitting: Emergency Medicine

## 2015-07-26 DIAGNOSIS — H109 Unspecified conjunctivitis: Secondary | ICD-10-CM

## 2015-07-26 MED ORDER — MOXIFLOXACIN HCL 0.5 % OP SOLN: 1.0000 [drp] | Freq: Three times a day (TID) | OPHTHALMIC | Status: DC

## 2015-07-26 NOTE — ED Provider Notes (Signed)
CSN: [id number]     Arrival date & time 07/26/15  1936 History   First MD Initiated Contact with Patient 07/26/15 2011     Chief Complaint  Patient presents with  . Eye Problem   (Consider location/radiation/quality/duration/timing/severity/associated sxs/prior Treatment) Patient is a 35 y.o. female presenting with eye problem. The history is provided by the patient.  Eye Problem Location:  Both Quality:  Sharp and burning Severity:  Moderate Onset quality:  Sudden Duration:  1 day Progression:  Worsening Chronicity:  New (onset in right eye this am and began in left eye this pm. wears contacts.) Context: contact lenses   Context comment:  65mo old child with pink eye. Associated symptoms: blurred vision, crusting, discharge, inflammation and redness   Associated symptoms: no itching and no photophobia     Past Medical History  Diagnosis Date  . Anxiety   . Headache(784.0)     sinus/ migraine headaches  . Uterine polyp   . PONV (postoperative nausea and vomiting)   . Chronic rectal pain   . Wears contact lenses   . Hx of varicella    Past Surgical History  Procedure Laterality Date  . Evaluation under anesthesia with hemorrhoidectomy N/A 11/15/2012    Procedure: EXAM UNDER ANESTHESIA WITH  HEMORRHOIDECTOMY;  Surgeon: Romie Levee, MD;  Location: [facility];  Service: General;  Laterality: N/A;  . Hysteroscopy N/A 05/14/2013    Procedure: HYSTEROSCOPY POLYPECTOMY  DILITATIONAND CURETTAGE, INTRAOP HYSTEROSALPINGOGRAM ;  Surgeon: Fermin Schwab, MD;  Location: [facility];  Service: Gynecology;  Laterality: N/A;   Family History  Problem Relation Age of Onset  . Bladder Cancer Father   . Cancer Father     lung brain  . Cancer Maternal Grandfather   . Cancer Paternal Grandfather    Social History  Substance Use Topics  . Smoking status: Never Smoker   . Smokeless tobacco: Never Used  . Alcohol Use: No     Comment: socially   OB History     Gravida Para Term Preterm AB TAB SAB Ectopic Multiple Living   0 1     Review of Systems  Constitutional: Negative.   HENT: Negative.   Eyes: Positive for blurred vision, discharge and redness. Negative for photophobia, pain, itching and visual disturbance.  All other systems reviewed and are negative.   Allergies  Demerol  Home Medications   Prior to Admission medications   Medication Sig Start Date End Date Taking? Authorizing Provider  Prenatal Vit-Fe Fumarate-FA (PRENATAL MULTIVITAMIN) TABS tablet Take 1 tablet by mouth daily at 12 noon.   Yes Historical Provider, MD  hydrocortisone-pramoxine [facility]) rectal foam Place 1 applicator rectally daily as needed for hemorrhoids or itching.    Historical Provider, MD  moxifloxacin (VIGAMOX) 0.5 % ophthalmic solution Place 1 drop into both eyes 3 (three) times daily. 07/26/15   Linna Hoff, MD  PRESCRIPTION MEDICATION Place 1 application rectally 2 (two) times daily as needed. Pt gets a nifedipine cream compounded for hemorrhoids.    Historical Provider, MD   Meds Ordered and Administered this Visit  Medications - No data to display  BP 130/87 mmHg  Pulse 77  Temp(Src) 97.4 F (36.3 C) (Oral)  SpO2 97%  LMP 06/28/2015 (Exact Date)  Breastfeeding? No No data found.   Physical Exam  Constitutional: She is oriented to person, place, and time. She appears well-developed and well-nourished. No distress.  Eyes: EOM are normal.  Pupils are equal, round, and reactive to light. Right eye exhibits discharge and exudate. Left eye exhibits discharge and exudate. Right conjunctiva is injected. Left conjunctiva is injected.  Lymphadenopathy:    She has no cervical adenopathy.  Neurological: She is alert and oriented to person, place, and time.  Skin: Skin is warm and dry.  Nursing note and vitals reviewed.   ED Course  Procedures (including critical care time)  Labs Review Labs Reviewed - No data to  display  Imaging Review No results found.   Visual Acuity Review  Right Eye Distance:   Left Eye Distance:   Bilateral Distance:    Right Eye Near:   Left Eye Near:    Bilateral Near:         MDM   1. Bilateral conjunctivitis        Linna HoffJames D Kindl, MD 07/26/15 2046

## 2015-07-26 NOTE — Discharge Instructions (Signed)
No more use of contact lenses until you contact your eye doctor

## 2015-07-26 NOTE — ED Notes (Signed)
[facility] a [facility] of an irritation and burning to her right eye that started when she woke up this am.

## 2015-08-11 ENCOUNTER — Other Ambulatory Visit: Payer: Self-pay | Admitting: Obstetrics and Gynecology

## 2015-08-12 LAB — CYTOLOGY - PAP

## 2016-03-19 ENCOUNTER — Ambulatory Visit (INDEPENDENT_AMBULATORY_CARE_PROVIDER_SITE_OTHER): Payer: BLUE CROSS/BLUE SHIELD

## 2016-03-19 ENCOUNTER — Ambulatory Visit (HOSPITAL_COMMUNITY)
Admission: EM | Admit: 2016-03-19 | Discharge: 2016-03-19 | Disposition: A | Discharge status: Home / Self Care | Payer: BLUE CROSS/BLUE SHIELD | Attending: Family Medicine | Admitting: Family Medicine

## 2016-03-19 ENCOUNTER — Encounter (HOSPITAL_COMMUNITY): Payer: Self-pay | Admitting: Emergency Medicine

## 2016-03-19 DIAGNOSIS — S62346A Nondisplaced fracture of base of fifth metacarpal bone, right hand, initial encounter for closed fracture: Secondary | ICD-10-CM

## 2016-03-19 MED ORDER — IBUPROFEN 800 MG PO TABS: 800.0000 mg | ORAL_TABLET | Freq: Three times a day (TID) | ORAL | 0 refills | Status: AC | PRN

## 2016-03-19 NOTE — Discharge Instructions (Signed)
Call Dr. Merlyn LotKuzma and schedule an appt for this week, he may put you in a removable splint

## 2016-03-19 NOTE — ED Notes (Signed)
Ortho tech present to place splint. 

## 2016-03-19 NOTE — Progress Notes (Signed)
Orthopedic Tech Progress Note Patient Details:  Judith Jackson Sep 15, 1980 [id number]  Ortho Devices Type of Ortho Device: Ace wrap, Ulna gutter splint Ortho Device/Splint Location: RUE Ortho Device/Splint Interventions: Ordered, Application   Judith Jackson, Judith Jackson 03/19/2016, 6:31 PM

## 2016-03-19 NOTE — ED Notes (Signed)
Ortho tech paged  

## 2016-03-19 NOTE — ED Provider Notes (Signed)
CSN: [id number]     Arrival date & time 03/19/16  1644 History   None    Chief Complaint  Patient presents with  . Hand Pain   (Consider location/radiation/quality/duration/timing/severity/associated sxs/prior Treatment) 36 yo presents with right hand pain stating that she struck her hand against the counter. She denies punching the counter. She notes pain and bruising along the right 4th and 5th MCP. Pain with flexion of the MCP's. No wrist pain.       Past Medical History:  Diagnosis Date  . Anxiety   . Chronic rectal pain   . Headache(784.0)    sinus/ migraine headaches  . Hx of varicella   . PONV (postoperative nausea and vomiting)   . Uterine polyp   . Wears contact lenses    Past Surgical History:  Procedure Laterality Date  . EVALUATION UNDER ANESTHESIA WITH HEMORRHOIDECTOMY N/A 11/15/2012   Procedure: EXAM UNDER ANESTHESIA WITH  HEMORRHOIDECTOMY;  Surgeon: Romie LeveeAlicia Thomas, MD;  [facility];  Service: General;  Laterality: N/A;  . HYSTEROSCOPY N/A 05/14/2013   Procedure: HYSTEROSCOPY POLYPECTOMY  DILITATIONAND CURETTAGE, INTRAOP HYSTEROSALPINGOGRAM ;  Surgeon: Fermin Schwabamer Yalcinkaya, MD;  Location: [facility];  Service: Gynecology;  Laterality: N/A;   Family History  Problem Relation Age of Onset  . Bladder Cancer Father   . Cancer Father     lung brain  . Cancer Maternal Grandfather   . Cancer Paternal Grandfather    Social History  Substance Use Topics  . Smoking status: Never Smoker  . Smokeless tobacco: Never Used  . Alcohol use No     Comment: socially   OB History    Gravida Para Term Preterm AB Living   1 1 1     1    SAB TAB Ectopic Multiple Live Births         0 1     Review of Systems  All other systems reviewed and are negative.   Allergies  Demerol [meperidine]  Home Medications   Prior to Admission medications   Medication Sig Start Date End Date Taking? Authorizing Provider  ibuprofen (ADVIL,MOTRIN) 800  MG tablet Take 1 tablet (800 mg total) by mouth every 8 (eight) hours as needed for moderate pain. 03/19/16   Riki SheerMichelle G Young, PA-C   Meds Ordered and Administered this Visit  Medications - No data to display  BP 99/61 (BP Location: Left Arm)   Pulse 81   Temp 99.2 F (37.3 C) (Oral)   Resp 16   LMP 03/12/2016 (Approximate)   SpO2 100%  No data found.   Physical Exam  Constitutional: She appears well-developed and well-nourished.  HENT:  Head: Normocephalic.  Musculoskeletal:  Mild ecchymosis along the right 5th MCP and shaft, tender to light palpation in this region. Poor effort for ROM. Right wrist is non-tender with poor effort to ROM  Skin: Skin is warm and dry.  Psychiatric: Her behavior is normal.  Nursing note and vitals reviewed.   Urgent Care Course     Procedures (including critical care time)  Labs Review Labs Reviewed - No data to display  Imaging Review Dg Hand Complete Right  Result Date: 03/19/2016 CLINICAL DATA:  Per pt: two hours ago, accidentally hit the right hand on the counter. Patient pointed to the right hand, patient can not flex or extend the right hand, bruising is medial 5th metacarpal, palmer, 5th pinky finger. EXAM: RIGHT HAND - COMPLETE 3+ VIEW COMPARISON:  None. FINDINGS: There is an  oblique acute fracture of the base the fifth metacarpal which does not appear to extend to the articular surface. There is dorsal displacement of the distal fragment, best seen on the lateral view. No additional fractures are identified. IMPRESSION: Fracture of the base of the fifth metacarpal. Electronically Signed   By: Norva Pavlov M.D.   On: 03/19/2016 17:47     Visual Acuity Review  Right Eye Distance:   Left Eye Distance:   Bilateral Distance:    Right Eye Near:   Left Eye Near:    Bilateral Near:         MDM   1. Closed nondisplaced fracture of base of fifth metacarpal bone of right hand, initial encounter    Place in a dorsal splint and  f/u with Ortho next week for further evaluation. May use Ibuprofen as needed for pain.     Riki Sheer, PA-C 03/19/16 [phone]

## 2016-03-19 NOTE — ED Notes (Signed)
All RUE digits warm and pink; pt c/o numbness to entire right hand.  Able to move all digits, but 5th digit with difficulty.  Pt offered Motrin - pt declined.

## 2016-03-19 NOTE — ED Triage Notes (Addendum)
[facility] of right hand pan that started today. The patient stated that she struck her hand on a counter top. The patient did have swelling to the lateral side of the right hand.

## 2017-02-09 IMAGING — DX DG HAND COMPLETE 3+V*R*
3 series · 3 of 3 positions shown · non-contrast
Comparison: None.

CLINICAL DATA: Per pt: two hours ago, accidentally hit the right
hand on the counter. Patient pointed to the right hand, patient can
not flex or extend the right hand, bruising is medial 5th
metacarpal, [place], 5th pinky finger.

EXAM:
RIGHT HAND - COMPLETE 3+ VIEW

[hand pa]
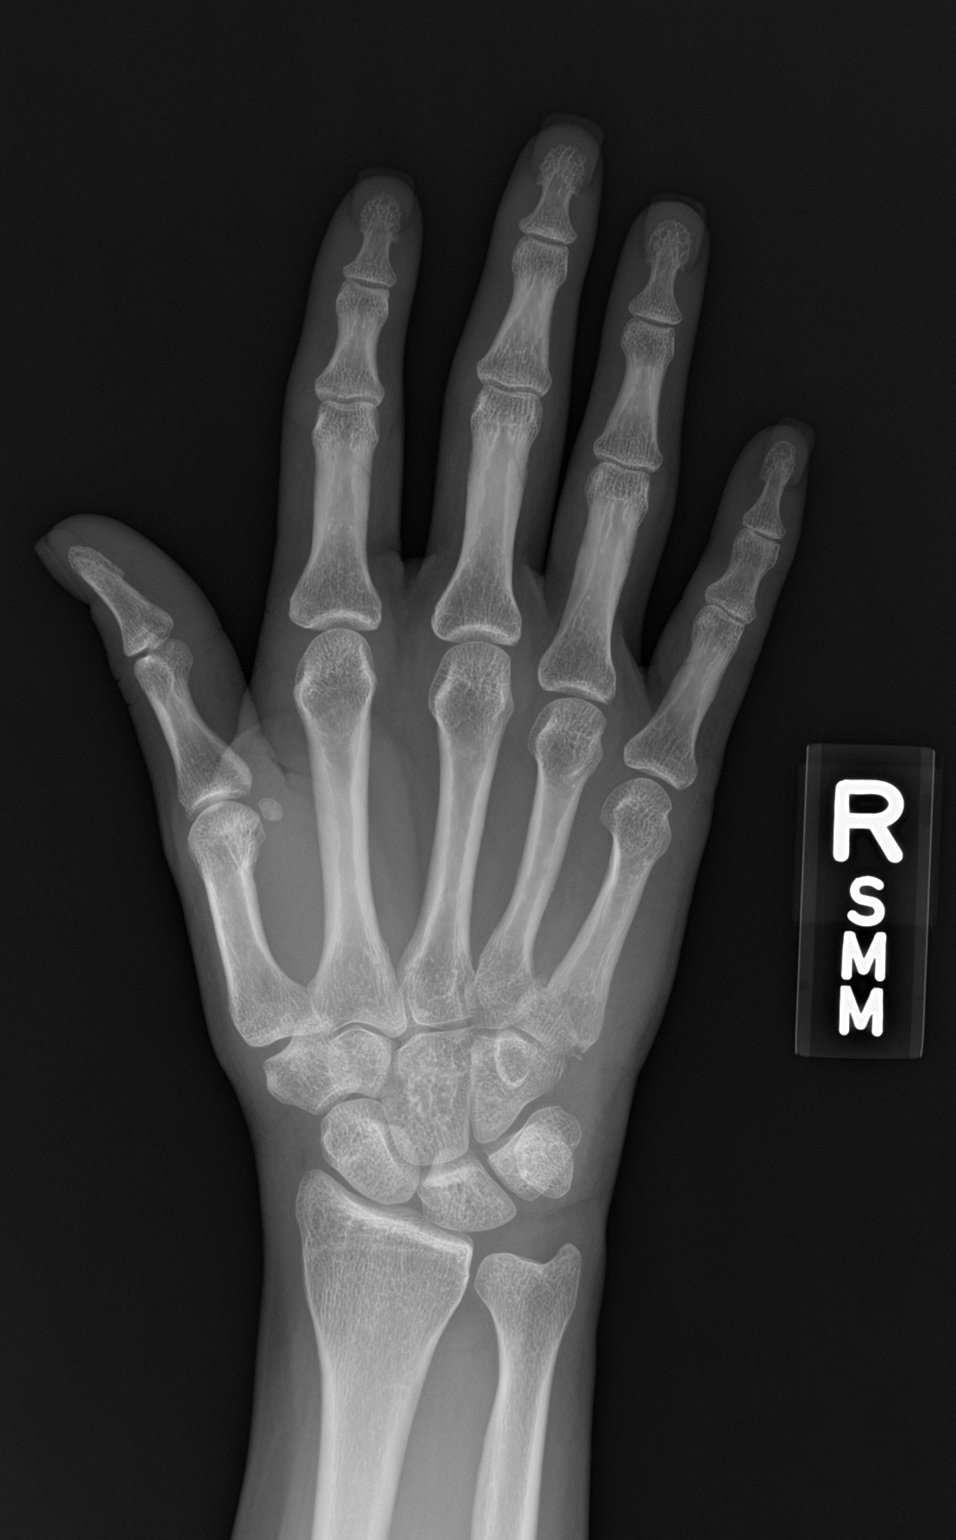

[hand obl]
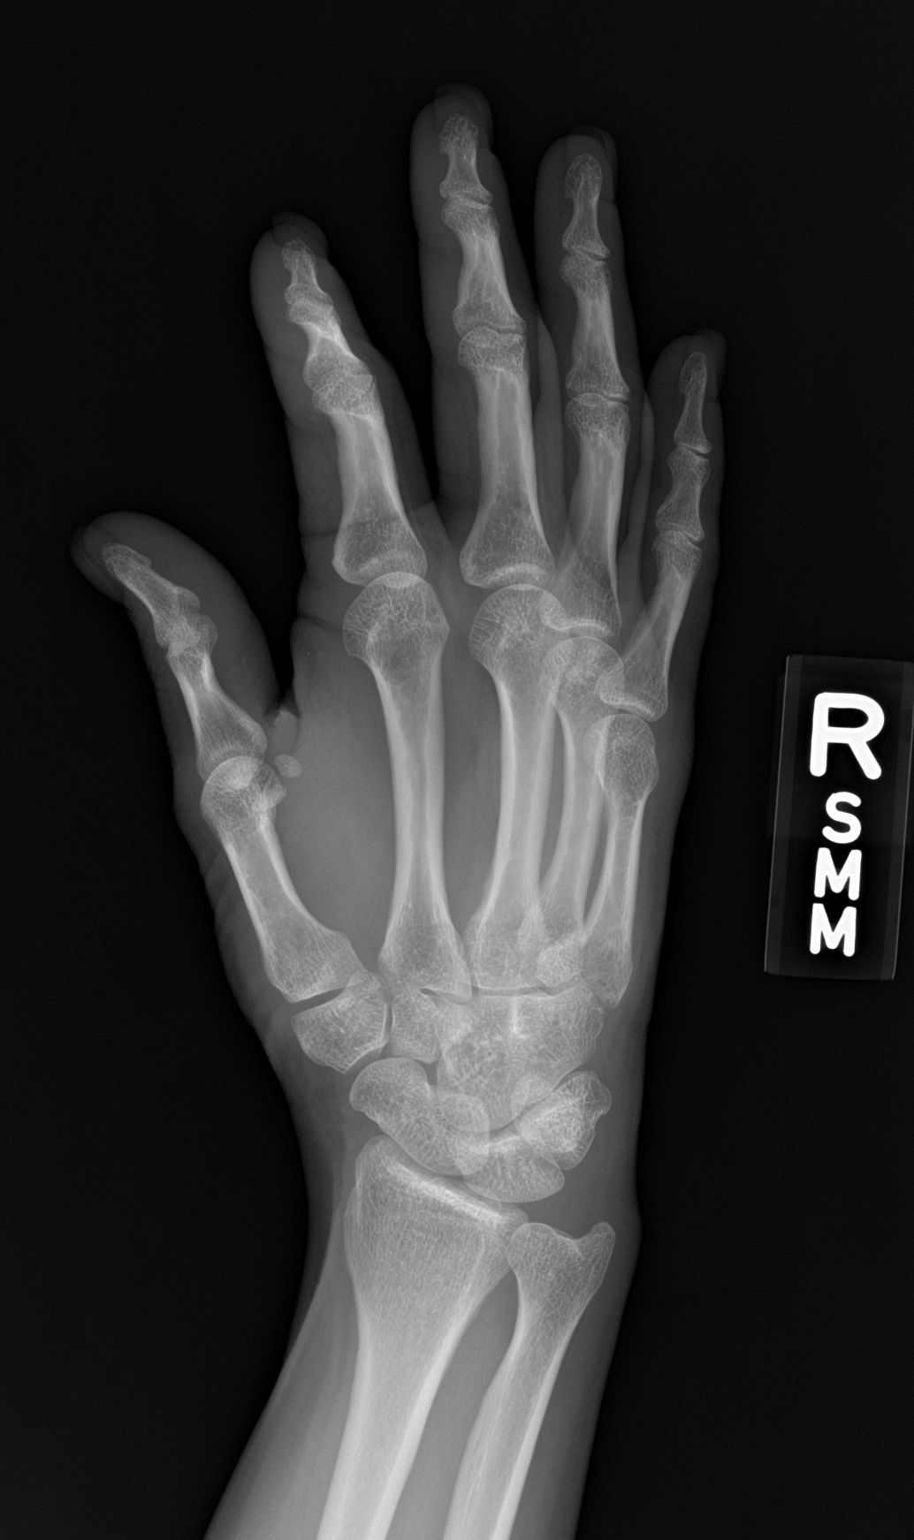

[hand lat]
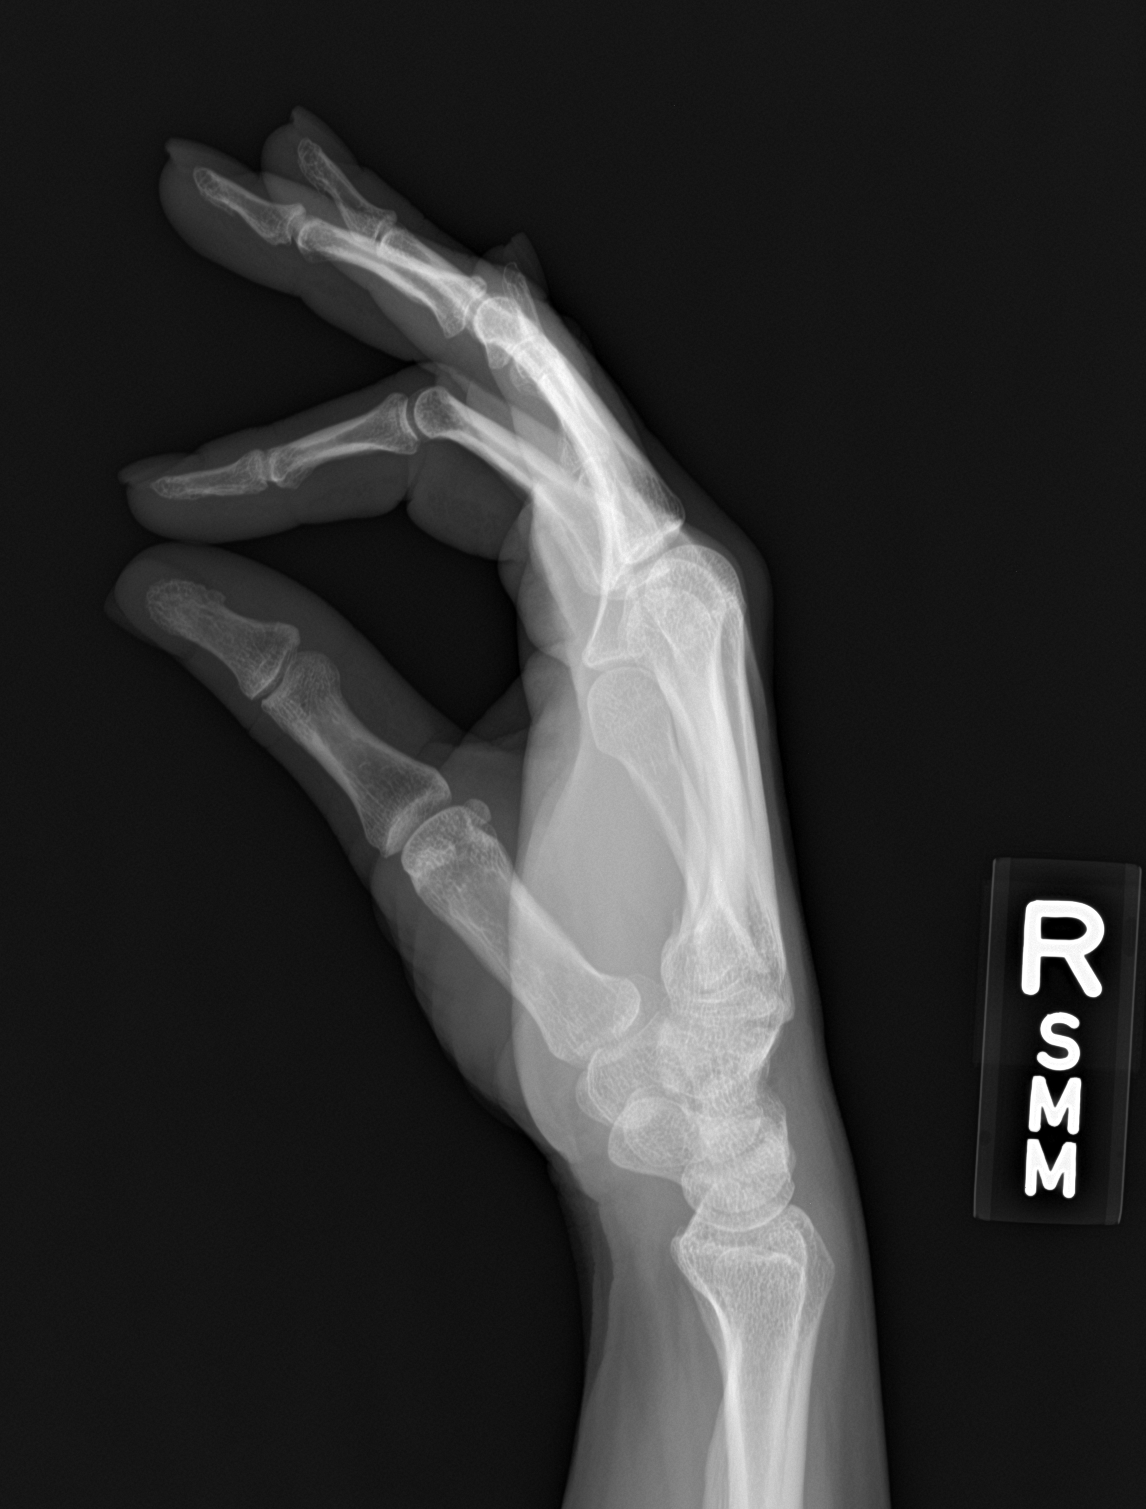

[3 of 3 positions shown; findings below may reference images not displayed]

FINDINGS: There is an oblique acute fracture of the base the fifth metacarpal
which does not appear to extend to the articular surface. There is
dorsal displacement of the distal fragment, best seen on the lateral
view. No additional fractures are identified.
IMPRESSION: Fracture of the base of the fifth metacarpal.

## 2019-03-03 ENCOUNTER — Encounter (HOSPITAL_COMMUNITY): Payer: Self-pay | Admitting: Psychiatry

## 2019-03-04 ENCOUNTER — Other Ambulatory Visit: Payer: Self-pay

## 2019-03-04 ENCOUNTER — Encounter (HOSPITAL_COMMUNITY): Payer: Self-pay | Admitting: Psychiatry

## 2019-03-04 ENCOUNTER — Ambulatory Visit (INDEPENDENT_AMBULATORY_CARE_PROVIDER_SITE_OTHER): Payer: PRIVATE HEALTH INSURANCE | Admitting: Psychiatry

## 2019-03-04 DIAGNOSIS — F411 Generalized anxiety disorder: Secondary | ICD-10-CM | POA: Insufficient documentation

## 2019-03-04 DIAGNOSIS — F331 Major depressive disorder, recurrent, moderate: Secondary | ICD-10-CM | POA: Diagnosis not present

## 2019-03-04 MED ORDER — LORAZEPAM 1 MG PO TABS: 1.0000 mg | ORAL_TABLET | Freq: Two times a day (BID) | ORAL | 0 refills | Status: DC | PRN

## 2019-03-04 MED ORDER — TRAZODONE HCL 100 MG PO TABS: 100.0000 mg | ORAL_TABLET | Freq: Every evening | ORAL | 0 refills | Status: DC | PRN

## 2019-03-04 MED ORDER — SERTRALINE HCL 100 MG PO TABS: 100.0000 mg | ORAL_TABLET | Freq: Every day | ORAL | 0 refills | Status: DC
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
## 2019-03-30 ENCOUNTER — Other Ambulatory Visit (HOSPITAL_COMMUNITY): Payer: Self-pay | Admitting: Psychiatry

## 2019-04-03 ENCOUNTER — Other Ambulatory Visit: Payer: Self-pay

## 2019-04-03 ENCOUNTER — Ambulatory Visit (INDEPENDENT_AMBULATORY_CARE_PROVIDER_SITE_OTHER): Payer: PRIVATE HEALTH INSURANCE | Admitting: Psychiatry

## 2019-04-03 DIAGNOSIS — F331 Major depressive disorder, recurrent, moderate: Secondary | ICD-10-CM

## 2019-04-03 DIAGNOSIS — F411 Generalized anxiety disorder: Secondary | ICD-10-CM | POA: Diagnosis not present

## 2019-04-03 MED ORDER — SERTRALINE HCL 100 MG PO TABS: 150.0000 mg | ORAL_TABLET | Freq: Every day | ORAL | 0 refills | Status: DC

## 2019-04-03 MED ORDER — LORAZEPAM 1 MG PO TABS: 1.0000 mg | ORAL_TABLET | Freq: Two times a day (BID) | ORAL | 0 refills | Status: AC | PRN

## 2019-04-03 MED ORDER — TRAZODONE HCL 100 MG PO TABS: 100.0000 mg | ORAL_TABLET | Freq: Every evening | ORAL | 0 refills | Status: DC | PRN
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
## 2019-05-06 ENCOUNTER — Other Ambulatory Visit (HOSPITAL_COMMUNITY): Payer: Self-pay | Admitting: Psychiatry

## 2019-05-09 ENCOUNTER — Other Ambulatory Visit: Payer: Self-pay

## 2019-05-09 ENCOUNTER — Ambulatory Visit (INDEPENDENT_AMBULATORY_CARE_PROVIDER_SITE_OTHER): Payer: PRIVATE HEALTH INSURANCE | Admitting: Psychiatry

## 2019-05-09 DIAGNOSIS — F331 Major depressive disorder, recurrent, moderate: Secondary | ICD-10-CM

## 2019-05-09 DIAGNOSIS — F411 Generalized anxiety disorder: Secondary | ICD-10-CM

## 2019-05-09 MED ORDER — ARIPIPRAZOLE 2 MG PO TABS: 2.0000 mg | ORAL_TABLET | Freq: Every day | ORAL | 2 refills | Status: DC

## 2019-05-09 MED ORDER — LORAZEPAM 1 MG PO TABS: 1.0000 mg | ORAL_TABLET | Freq: Two times a day (BID) | ORAL | 1 refills | Status: AC | PRN

## 2019-05-09 MED ORDER — TRAZODONE HCL 100 MG PO TABS: 100.0000 mg | ORAL_TABLET | Freq: Every evening | ORAL | 2 refills | Status: DC | PRN

## 2019-05-09 MED ORDER — SERTRALINE HCL 100 MG PO TABS: 200.0000 mg | ORAL_TABLET | Freq: Every day | ORAL | 2 refills | Status: DC
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
## 2019-05-26 ENCOUNTER — Encounter (HOSPITAL_COMMUNITY): Payer: Self-pay | Admitting: Licensed Clinical Social Worker

## 2019-05-26 ENCOUNTER — Other Ambulatory Visit: Payer: Self-pay

## 2019-05-26 ENCOUNTER — Ambulatory Visit (INDEPENDENT_AMBULATORY_CARE_PROVIDER_SITE_OTHER): Payer: PRIVATE HEALTH INSURANCE | Admitting: Licensed Clinical Social Worker

## 2019-05-26 DIAGNOSIS — F331 Major depressive disorder, recurrent, moderate: Secondary | ICD-10-CM

## 2019-05-26 DIAGNOSIS — F411 Generalized anxiety disorder: Secondary | ICD-10-CM

## 2019-05-26 NOTE — Progress Notes (Signed)
Patient presented for her initial [facility] appointment. She presented frustrated and angry. Cln told patient the appointment would be as assessment and would last 1 hour. She reports she doesn't have 1 hour. "I don't really want therapy and I'm not really taking the medication regularly I was prescribed. I don't believe it medication." Cln asked patient how to proceed since she only has 15 minutes available. Rescheduled appointment to 5/3 at 1pm, during her lunch break. Patient reports she thinks her insurance will only pay for phone visits. Suggested she confirm this before her appointment. Ottis Stain, LCAS

## 2019-06-09 ENCOUNTER — Telehealth (HOSPITAL_COMMUNITY): Payer: Self-pay

## 2019-06-09 NOTE — Telephone Encounter (Signed)
Pt called stating she was overly tired throughout the day. States she gets a headache at times as well. Recently spoke with therapist who suggested pt take her medications in the morning instead of at night (presumably the abilify and sertraline). Pt states sertraline has not been helpful for her mood. Has been more depressed lately. Pt reports not getting much sleep lately and does use prn trazodone although this makes her tired the next day as well. Is requesting call from MD to advise on medication management.   CB: [phone]

## 2019-06-10 ENCOUNTER — Other Ambulatory Visit (HOSPITAL_COMMUNITY): Payer: Self-pay | Admitting: Psychiatry

## 2019-06-10 MED ORDER — DULOXETINE HCL 30 MG PO CPEP: ORAL_CAPSULE | ORAL | 0 refills | Status: DC

## 2019-06-10 NOTE — Telephone Encounter (Signed)
I spoke with Judith Jackson and we decided to cross taper sertraline to duloxetine (and stop aripiprazole as she is concerned about weight gain with this medication).

## 2019-07-25 ENCOUNTER — Telehealth (INDEPENDENT_AMBULATORY_CARE_PROVIDER_SITE_OTHER): Payer: PRIVATE HEALTH INSURANCE | Admitting: Psychiatry

## 2019-07-25 ENCOUNTER — Other Ambulatory Visit: Payer: Self-pay

## 2019-07-25 DIAGNOSIS — F411 Generalized anxiety disorder: Secondary | ICD-10-CM

## 2019-07-25 DIAGNOSIS — F331 Major depressive disorder, recurrent, moderate: Secondary | ICD-10-CM

## 2019-07-25 MED ORDER — DULOXETINE HCL 60 MG PO CPEP: 60.0000 mg | ORAL_CAPSULE | Freq: Every day | ORAL | 2 refills | Status: DC

## 2019-07-25 MED ORDER — LORAZEPAM 1 MG PO TABS: 1.0000 mg | ORAL_TABLET | Freq: Two times a day (BID) | ORAL | 2 refills | Status: DC | PRN
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
## 2019-09-29 ENCOUNTER — Other Ambulatory Visit: Payer: Self-pay

## 2019-09-29 ENCOUNTER — Telehealth (INDEPENDENT_AMBULATORY_CARE_PROVIDER_SITE_OTHER): Payer: PRIVATE HEALTH INSURANCE | Admitting: Psychiatry

## 2019-09-29 DIAGNOSIS — F411 Generalized anxiety disorder: Secondary | ICD-10-CM

## 2019-09-29 DIAGNOSIS — F331 Major depressive disorder, recurrent, moderate: Secondary | ICD-10-CM | POA: Diagnosis not present

## 2019-09-29 MED ORDER — LORAZEPAM 1 MG PO TABS: 1.0000 mg | ORAL_TABLET | Freq: Two times a day (BID) | ORAL | 2 refills | Status: AC | PRN

## 2019-09-29 MED ORDER — DULOXETINE HCL 60 MG PO CPEP: 60.0000 mg | ORAL_CAPSULE | Freq: Every day | ORAL | 2 refills | Status: DC

## 2019-09-29 MED ORDER — TRAZODONE HCL 50 MG PO TABS: 50.0000 mg | ORAL_TABLET | Freq: Every evening | ORAL | 2 refills | Status: DC | PRN
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
## 2020-01-20 ENCOUNTER — Other Ambulatory Visit: Payer: Self-pay

## 2020-01-20 ENCOUNTER — Telehealth (INDEPENDENT_AMBULATORY_CARE_PROVIDER_SITE_OTHER): Payer: PRIVATE HEALTH INSURANCE | Admitting: Psychiatry

## 2020-01-20 DIAGNOSIS — F331 Major depressive disorder, recurrent, moderate: Secondary | ICD-10-CM | POA: Diagnosis not present

## 2020-01-20 DIAGNOSIS — F411 Generalized anxiety disorder: Secondary | ICD-10-CM

## 2020-01-20 MED ORDER — DULOXETINE HCL 60 MG PO CPEP: 60.0000 mg | ORAL_CAPSULE | Freq: Every day | ORAL | 1 refills | Status: DC

## 2020-01-20 NOTE — Progress Notes (Signed)
BH MD/PA/NP OP Progress Note  01/20/2020 11:50 AM Judith Jackson  MRN:  [id number] Interview was conducted by phone and I verified that I was speaking with the correct person using two identifiers. I discussed the limitations of evaluation and management by telemedicine and  the availability of in person appointments. Patient expressed understanding and agreed to proceed. Participants in the visit: patient (location - home); physician (location - home office).  Chief Complaint: Anxious, depressed.  HPI: 39 yo single white female who has been depressedand anxiousfor the past several months. This stems from the situation she found herself in: she and the father of her 24 yo son are living together but this man is not involved in care of their child, is not supportive, keeps contact with other women and she finds his attitude emotionally abusive. She lost some money in real estate transaction and has no option to move out at this time. Her family would like her to move to [facility] where her sister lives and where her mother (currently in [place]) plans to relocate. In the past shehad been prescribed Prozac 20 mg and clonazepam in the past with unclear benefit. We also tried sertraline up to 150 mg at HS and trazodone to 100 mg. Mood has not improved and she started to feel more tired. We considered adding aripiprazole for augmentation but she decided against it due to concerns about weigh gain. Ultimately we changed sertraline to duloxetine which she tolerated well but mood has not improved much on 60 mg and she eventually stopped taking it. She remains depressed, tearful and anxious. She stopped taking trazodone as it was causing headaches but finds lorazepam helpful. At this time she is willing to go back on duloxetine and perhaps try a higher dose later.     Visit Diagnosis:    ICD-10-CM   1. Moderate episode of recurrent major depressive disorder (HCC)  F33.1   2. GAD (generalized anxiety disorder)   F41.1     Past Psychiatric History: Please see intake H&P.  Past Medical History:  Past Medical History:  Diagnosis Date  . Anxiety   . Chronic rectal pain   . Headache(784.0)    sinus/ migraine headaches  . Hx of varicella   . PONV (postoperative nausea and vomiting)   . Uterine polyp   . Wears contact lenses     Past Surgical History:  Procedure Laterality Date  . EVALUATION UNDER ANESTHESIA WITH HEMORRHOIDECTOMY N/A 11/15/2012   Procedure: EXAM UNDER ANESTHESIA WITH  HEMORRHOIDECTOMY;  Surgeon: Romie Levee, MD;  Location: [facility];  Service: General;  Laterality: N/A;  . HYSTEROSCOPY N/A 05/14/2013   Procedure: HYSTEROSCOPY POLYPECTOMY  DILITATIONAND CURETTAGE, INTRAOP HYSTEROSALPINGOGRAM ;  Surgeon: Fermin Schwab, MD;  Location: [facility];  Service: Gynecology;  Laterality: N/A;    Family Psychiatric History: None.  Family History:  Family History  Problem Relation Age of Onset  . Bladder Cancer Father   . Cancer Father        lung brain  . Cancer Maternal Grandfather   . Cancer Paternal Grandfather     Social History:  Social History   Socioeconomic History  . Marital status: Single    Spouse name: Not on file  . Number of children: 1  . Years of education: Not on file  . Highest education level: Not on file  Occupational History  . Not on file  Tobacco Use  . Smoking status: Never Smoker  . Smokeless tobacco: Never  Used  Vaping Use  . Vaping Use: Never used  Substance and Sexual Activity  . Alcohol use: No    Alcohol/week: 0.0 standard drinks    Comment: socially  . Drug use: No  . Sexual activity: Yes    Birth control/protection: None    Comment: patient refused   Other Topics Concern  . Not on file  Social History Narrative  . Not on file   Social Determinants of Health   Financial Resource Strain:   . Difficulty of Paying Living Expenses: Not on file  Food Insecurity:   . Worried About Brewing technologist in the Last Year: Not on file  . Ran Out of Food in the Last Year: Not on file  Transportation Needs:   . Lack of Transportation (Medical): Not on file  . Lack of Transportation (Non-Medical): Not on file  Physical Activity:   . Days of Exercise per Week: Not on file  . Minutes of Exercise per Session: Not on file  Stress:   . Feeling of Stress : Not on file  Social Connections:   . Frequency of Communication with Friends and Family: Not on file  . Frequency of Social Gatherings with Friends and Family: Not on file  . Attends Religious Services: Not on file  . Active Member of Clubs or Organizations: Not on file  . Attends Banker Meetings: Not on file  . Marital Status: Not on file    Allergies:  Allergies  Allergen Reactions  . Demerol [Meperidine] Other (See Comments)    halluninations    Metabolic Disorder Labs: No results found for: HGBA1C, MPG No results found for: PROLACTIN No results found for: CHOL, TRIG, HDL, CHOLHDL, VLDL, LDLCALC No results found for: TSH  Therapeutic Level Labs: No results found for: LITHIUM No results found for: VALPROATE No components found for:  CBMZ  Current Medications: Current Outpatient Medications  Medication Sig Dispense Refill  . DULoxetine (CYMBALTA) 60 MG capsule Take 1 capsule (60 mg total) by mouth daily at 6 PM. 30 capsule 2  . ibuprofen (ADVIL,MOTRIN) 800 MG tablet Take 1 tablet (800 mg total) by mouth every 8 (eight) hours as needed for moderate pain. 21 tablet 0  . LORazepam (ATIVAN) 1 MG tablet Take 1 tablet (1 mg total) by mouth 2 (two) times daily as needed for anxiety. 60 tablet 2   No current facility-administered medications for this visit.     Psychiatric Specialty Exam: Review of Systems  Psychiatric/Behavioral: The patient is nervous/anxious.   All other systems reviewed and are negative.   There were no vitals taken for this visit.There is no height or weight on file to calculate BMI.   General Appearance: NA  Eye Contact:  NA  Speech:  Clear and Coherent and Normal Rate  Volume:  Normal  Mood:  Anxious and Depressed  Affect:  NA  Thought Process:  Goal Directed  Orientation:  Full (Time, Place, and Person)  Thought Content: Rumination   Suicidal Thoughts:  No  Homicidal Thoughts:  No  Memory:  Immediate;   Good  Judgement:  Good  Insight:  Fair  Psychomotor Activity:  NA  Concentration:  Concentration: Fair  Recall:  Good  Fund of Knowledge: Good  Language: Good  Akathisia:  Negative  Handed:  Right  AIMS (if indicated): not done  Assets:  Communication Skills Desire for Improvement Housing Resilience  ADL's:  Intact  Cognition: WNL  Sleep:  Fair  Assessment and Plan: 39 yo single white female who has been depressedand anxiousfor the past several months. This stems from the situation she found herself in: she and the father of her 18 yo son are living together but this man is not involved in care of their child, is not supportive, keeps contact with other women and she finds his attitude emotionally abusive. She lost some money in real estate transaction and has no option to move out at this time. Her family would like her to move to [facility] where her sister lives and where her mother (currently in [place]) plans to relocate. In the past shehad been prescribed Prozac 20 mg and clonazepam in the past with unclear benefit. We also tried sertraline up to 150 mg at HS and trazodone to 100 mg. Mood has not improved and she started to feel more tired. We considered adding aripiprazole for augmentation but she decided against it due to concerns about weigh gain. Ultimately we changed sertraline to duloxetine which she tolerated well but mood has not improved much on 60 mg and she eventually stopped taking it. She remains depressed, tearful and anxious. There is no hx of mania, psychosis; she is not suicidal. She stopped taking trazodone as it was causing headaches  but finds lorazepam helpful. At this time she is willing to go back on duloxetine and perhaps try a higher dose later.    Dx: MDD recurrent, moderate; GAD  Plan:Resume duloxetine 60 mg at PM and continue lorazepam 1 mg bid prn anxiety(still has 2 refills). Next visit in5 weeks.The plan was discussed with patient who had an opportunity to ask questions and these were all answered.I spend in phone consultationwith the patient.   Magdalene Patricia, MD 01/20/2020, 11:50 AM

## 2020-02-25 ENCOUNTER — Other Ambulatory Visit: Payer: Self-pay

## 2020-02-25 ENCOUNTER — Telehealth (INDEPENDENT_AMBULATORY_CARE_PROVIDER_SITE_OTHER): Payer: PRIVATE HEALTH INSURANCE | Admitting: Psychiatry

## 2020-02-25 DIAGNOSIS — F411 Generalized anxiety disorder: Secondary | ICD-10-CM

## 2020-02-25 DIAGNOSIS — F331 Major depressive disorder, recurrent, moderate: Secondary | ICD-10-CM

## 2020-02-25 MED ORDER — DESVENLAFAXINE SUCCINATE ER 50 MG PO TB24: 50.0000 mg | ORAL_TABLET | Freq: Every day | ORAL | 1 refills | Status: AC

## 2020-02-25 MED ORDER — LORAZEPAM 1 MG PO TABS: 1.0000 mg | ORAL_TABLET | Freq: Two times a day (BID) | ORAL | 2 refills | Status: AC | PRN

## 2020-02-25 NOTE — Progress Notes (Signed)
BH MD/PA/NP OP Progress Note  02/25/2020 11:42 AM Ameerah Huffstetler  MRN:  [id number] Interview was conducted by phone and I verified that I was speaking with the correct person using two identifiers. I discussed the limitations of evaluation and management by telemedicine and  the availability of in person appointments. Patient expressed understanding and agreed to proceed. Participants in the visit: patient (location - home); physician (location - home office).  Chief Complaint: Anxiety,depression.  HPI: 40 yo single white female who has been depressedand anxiousfor the past several months. This stems from the situation she found herself in: she and the father of her5yo son are living together but this man is not involved in care of their child, is not supportive, keeps contact with other women and she finds his attitude emotionally abusive. She lost some money in real estate transaction and has no option to move out at this time. Her family would like her to move to [facility] where her sister lives and where her mother (currently in [place]) plans to relocate. In the past shehad been prescribed Prozac 20 mg and clonazepam in the past with unclear benefit. We also tried sertraline up to 150 mg at HS and trazodone to 100 mg. Mood has not improved and she started to feel more tired. We considered adding aripiprazole for augmentation but she decided against it due to concerns about weigh gain. Ultimately we changed sertraline to duloxetine which she tolerated well but mood has not improved much on 60 mg and she eventually stopped taking it. She remains depressed, tearful and anxious. There is no hx of mania, psychosis; she is not suicidal. She stopped taking trazodone as it was causing headaches but finds lorazepam helpful. She was willing to go back on duloxetine but after we started it she developed headaches which she did not experience on this mediation in the past and eventually stopped it.      Visit Diagnosis:    ICD-10-CM   1. GAD (generalized anxiety disorder)  F41.1   2. Moderate episode of recurrent major depressive disorder (HCC)  F33.1     Past Psychiatric History: Please see intake H&P.  Past Medical History:  Past Medical History:  Diagnosis Date  . Anxiety   . Chronic rectal pain   . Headache(784.0)    sinus/ migraine headaches  . Hx of varicella   . PONV (postoperative nausea and vomiting)   . Uterine polyp   . Wears contact lenses     Past Surgical History:  Procedure Laterality Date  . EVALUATION UNDER ANESTHESIA WITH HEMORRHOIDECTOMY N/A 11/15/2012   Procedure: EXAM UNDER ANESTHESIA WITH  HEMORRHOIDECTOMY;  Surgeon: Romie Levee, MD;  Location: [facility];  Service: General;  Laterality: N/A;  . HYSTEROSCOPY N/A 05/14/2013   Procedure: HYSTEROSCOPY POLYPECTOMY  DILITATIONAND CURETTAGE, INTRAOP HYSTEROSALPINGOGRAM ;  Surgeon: Fermin Schwab, MD;  Location: [facility];  Service: Gynecology;  Laterality: N/A;    Family Psychiatric History: None.  Family History:  Family History  Problem Relation Age of Onset  . Bladder Cancer Father   . Cancer Father        lung brain  . Cancer Maternal Grandfather   . Cancer Paternal Grandfather     Social History:  Social History   Socioeconomic History  . Marital status: Single    Spouse name: Not on file  . Number of children: 1  . Years of education: Not on file  . Highest education level: Not on file  Occupational History  . Not on file  Tobacco Use  . Smoking status: Never Smoker  . Smokeless tobacco: Never Used  Vaping Use  . Vaping Use: Never used  Substance and Sexual Activity  . Alcohol use: No    Alcohol/week: 0.0 standard drinks    Comment: socially  . Drug use: No  . Sexual activity: Yes    Birth control/protection: None    Comment: patient refused   Other Topics Concern  . Not on file  Social History Narrative  . Not on file   Social  Determinants of Health   Financial Resource Strain: Not on file  Food Insecurity: Not on file  Transportation Needs: Not on file  Physical Activity: Not on file  Stress: Not on file  Social Connections: Not on file    Allergies:  Allergies  Allergen Reactions  . Demerol [Meperidine] Other (See Comments)    halluninations    Metabolic Disorder Labs: No results found for: HGBA1C, MPG No results found for: PROLACTIN No results found for: CHOL, TRIG, HDL, CHOLHDL, VLDL, LDLCALC No results found for: TSH  Therapeutic Level Labs: No results found for: LITHIUM No results found for: VALPROATE No components found for:  CBMZ  Current Medications: Current Outpatient Medications  Medication Sig Dispense Refill  . desvenlafaxine (PRISTIQ) 50 MG 24 hr tablet Take 1 tablet (50 mg total) by mouth daily. 30 tablet 1  . LORazepam (ATIVAN) 1 MG tablet Take 1 tablet (1 mg total) by mouth 2 (two) times daily as needed for anxiety. 60 tablet 2  . ibuprofen (ADVIL,MOTRIN) 800 MG tablet Take 1 tablet (800 mg total) by mouth every 8 (eight) hours as needed for moderate pain. 21 tablet 0   No current facility-administered medications for this visit.     Psychiatric Specialty Exam: Review of Systems  Psychiatric/Behavioral: The patient is nervous/anxious.   All other systems reviewed and are negative.   There were no vitals taken for this visit.There is no height or weight on file to calculate BMI.  General Appearance: NA  Eye Contact:  NA  Speech:  Clear and Coherent and Normal Rate  Volume:  Normal  Mood:  Anxious and Depressed  Affect:  NA  Thought Process:  Goal Directed  Orientation:  Full (Time, Place, and Person)  Thought Content: Rumination   Suicidal Thoughts:  No  Homicidal Thoughts:  No  Memory:  Immediate;   Good Recent;   Good Remote;   Good  Judgement:  Good  Insight:  Good  Psychomotor Activity:  NA  Concentration:  Concentration: Fair  Recall:  Good  Fund of  Knowledge: Good  Language: Good  Akathisia:  Negative  Handed:  Right  AIMS (if indicated): not done  Assets:  Communication Skills Desire for Improvement Financial Resources/Insurance Housing  ADL's:  Intact  Cognition: WNL  Sleep:  Fair    Assessment and Plan: 40 yo single white female who has been depressedand anxiousfor the past several months. This stems from the situation she found herself in: she and the father of her5yo son are living together but this man is not involved in care of their child, is not supportive, keeps contact with other women and she finds his attitude emotionally abusive. She lost some money in real estate transaction and has no option to move out at this time. Her family would like her to move to [facility] where her sister lives and where her mother (currently in [place]) plans to relocate. In  the past shehad been prescribed Prozac 20 mg and clonazepam in the past with unclear benefit. We also tried sertraline up to 150 mg at HS and trazodone to 100 mg. Mood has not improved and she started to feel more tired. We considered adding aripiprazole for augmentation but she decided against it due to concerns about weigh gain. Ultimately we changed sertraline to duloxetine which she tolerated well but mood has not improved much on 60 mg and she eventually stopped taking it. She remains depressed, tearful and anxious. There is no hx of mania, psychosis; she is not suicidal. She stopped taking trazodone as it was causing headaches but finds lorazepam helpful. She was willing to go back on duloxetine but after we started it she developed headaches which she did not experience on this mediation in the past and eventually stopped it.   Dx: MDD recurrent, moderate; GAD  Plan:We will try desvenlafaxine 50 mg daily and continue lorazepam 1 mg bid prn anxiety. Next visit in4 weeks.The plan was discussed with patient who had an opportunity to ask questions and these were all  answered.I spend6minutes in phone consultationwith the patient.   Magdalene Patricia, MD 02/25/2020, 11:42 AM

## 2020-04-26 ENCOUNTER — Other Ambulatory Visit: Payer: Self-pay

## 2020-04-26 ENCOUNTER — Telehealth (HOSPITAL_COMMUNITY): Payer: PRIVATE HEALTH INSURANCE | Admitting: Psychiatry

## 2020-05-04 ENCOUNTER — Other Ambulatory Visit: Payer: Self-pay

## 2020-05-04 ENCOUNTER — Ambulatory Visit (HOSPITAL_COMMUNITY): Payer: Self-pay

## 2020-05-04 ENCOUNTER — Emergency Department (INDEPENDENT_AMBULATORY_CARE_PROVIDER_SITE_OTHER): Payer: PRIVATE HEALTH INSURANCE

## 2020-05-04 ENCOUNTER — Emergency Department
Admission: RE | Admit: 2020-05-04 | Discharge: 2020-05-04 | Disposition: A | Discharge status: Home / Self Care | Payer: PRIVATE HEALTH INSURANCE | Source: Ambulatory Visit

## 2020-05-04 VITALS — BP 122/81 | HR 64 | Temp 98.0°F | Resp 17 | Ht 62.0 in | Wt 155.0 lb

## 2020-05-04 DIAGNOSIS — S93491A Sprain of other ligament of right ankle, initial encounter: Secondary | ICD-10-CM

## 2020-05-04 DIAGNOSIS — W19XXXA Unspecified fall, initial encounter: Secondary | ICD-10-CM

## 2020-05-04 DIAGNOSIS — M25571 Pain in right ankle and joints of right foot: Secondary | ICD-10-CM | POA: Diagnosis not present

## 2020-05-04 NOTE — ED Provider Notes (Signed)
[facility]    CSN: [id number] Arrival date & time: 05/04/20  1249      History   Chief Complaint Chief Complaint  Patient presents with  . Ankle Pain    Right    HPI Judith Jackson is a 40 y.o. female.   HPI   Patient presents with ankle pain.  She tripped and fell yesterday at her home.  She states the pain initially was intense, and she could not put any weight on her leg.  It made her feel nauseated.  She felt a pop.  She took some ibuprofen last night.  Today it is still painful so she is here for evaluation.   Past Medical History:  Diagnosis Date  . Anxiety   . Chronic rectal pain   . Headache(784.0)    sinus/ migraine headaches  . Hx of varicella   . PONV (postoperative nausea and vomiting)   . Uterine polyp   . Wears contact lenses     Patient Active Problem List   Diagnosis Date Noted  . Moderate episode of recurrent major depressive disorder (HCC) 03/04/2019  . GAD (generalized anxiety disorder) 03/04/2019  . Pregnant 03/31/2015  . Spontaneous vaginal delivery 03/31/2015  . Anal fissure 03/03/2013  . 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 EVALUATION UNDER ANESTHESIA WITH HEMORRHOIDECTOMY N/A 11/15/2012   Procedure: EXAM UNDER ANESTHESIA WITH  HEMORRHOIDECTOMY;  Surgeon: Romie Levee, MD;  Location: [facility];  Service: General;  Laterality: N/A;  . HYSTEROSCOPY N/A 05/14/2013   Procedure: HYSTEROSCOPY POLYPECTOMY  DILITATIONAND CURETTAGE, INTRAOP HYSTEROSALPINGOGRAM ;  Surgeon: Fermin Schwab, MD;  Location: [facility];  Service: Gynecology;  Laterality: N/A;    OB History    Gravida  1   Para  1   Term  1   Preterm      AB      Living  1     SAB      IAB      Ectopic      Multiple  0   Live Births  1            Home Medications    Prior to Admission medications   Medication Sig Start Date End Date Taking? Authorizing Provider   desvenlafaxine (PRISTIQ) 50 MG 24 hr tablet Take 1 tablet (50 mg total) by mouth daily. 02/25/20 04/25/20  Pucilowski, Roosvelt Maser, MD  ibuprofen (ADVIL,MOTRIN) 800 MG tablet Take 1 tablet (800 mg total) by mouth every 8 (eight) hours as needed for moderate pain. 03/19/16   Riki Sheer, PA-C  LORazepam (ATIVAN) 1 MG tablet Take 1 tablet (1 mg total) by mouth 2 (two) times daily as needed for anxiety. 02/25/20 05/25/20  Pucilowski, Roosvelt Maser, MD    Family History Family History  Problem Relation Age of Onset  . Bladder Cancer Father   . Cancer Father        lung brain  . Cancer Maternal Grandfather   . Cancer Paternal Grandfather     Social History Social History   Tobacco Use  . Smoking status: Never Smoker  . Smokeless tobacco: Never Used  Vaping Use  . Vaping Use: Never used  Substance Use Topics  . Alcohol use: No    Alcohol/week: 0.0 standard drinks    Comment: socially  . Drug use: No     Allergies   Demerol [meperidine]   Review of Systems Review of Systems  See HPI  Physical Exam Triage Vital Signs ED Triage Vitals  Enc Vitals Group     BP 05/04/20 1303 122/81     Pulse Rate 05/04/20 1303 64     Resp 05/04/20 1303 17     Temp 05/04/20 1303 98 F (36.7 C)     Temp Source 05/04/20 1303 Oral     SpO2 05/04/20 1303 97 %     Weight 05/04/20 1304 155 lb (70.3 kg)     Height 05/04/20 1304 5\' 2"  (1.575 m)     Head Circumference --      Peak Flow --      Pain Score 05/04/20 1303 6     Pain Loc --      Pain Edu? --      Excl. in GC? --    No data found.  Updated Vital Signs BP 122/81 (BP Location: Left Arm)   Pulse 64   Temp 98 F (36.7 C) (Oral)   Resp 17   Ht 5\' 2"  (1.575 m)   Wt 70.3 kg   LMP 04/27/2020 (Approximate)   SpO2 97%   BMI 28.35 kg/m   Physical Exam Constitutional:      General: She is not in acute distress.    Appearance: She is well-developed.  HENT:     Head: Normocephalic and atraumatic.     Mouth/Throat:     Comments:  Mask is in place Eyes:     Conjunctiva/sclera: Conjunctivae normal.     Pupils: Pupils are equal, round, and reactive to light.  Cardiovascular:     Rate and Rhythm: Normal rate.  Pulmonary:     Effort: Pulmonary effort is normal. No respiratory distress.  Abdominal:     General: There is no distension.     Palpations: Abdomen is soft.  Musculoskeletal:        General: Normal range of motion.     Cervical back: Normal range of motion.     Comments: Moderate tenderness in the lateral ankle.  No tenderness of the lateral foot.  Tenderness over ATFL.  No instability.  Skin:    General: Skin is warm and dry.  Neurological:     Mental Status: She is alert.     Gait: Gait abnormal.  Psychiatric:        Behavior: Behavior normal.      UC Treatments / Results  Labs (all labs ordered are listed, but only abnormal results are displayed) Labs Reviewed - No data to display  EKG   Radiology DG Ankle Complete Right  Result Date: 05/04/2020 CLINICAL DATA:  Right ankle pain after fall last night. EXAM: RIGHT ANKLE - COMPLETE 3+ VIEW COMPARISON:  None. FINDINGS: There is no evidence of fracture, dislocation, or joint effusion. There is no evidence of arthropathy or other focal bone abnormality. Soft tissue swelling is seen over the lateral malleolus. IMPRESSION: No fracture or dislocation is noted. Soft tissue swelling is seen over lateral malleolus suggesting ligamentous injury. Electronically Signed   By: 04/29/2020 M.D.   On: 05/04/2020 13:47    Procedures Procedures (including critical care time)  Medications Ordered in UC Medications - No data to display  Initial Impression / Assessment and Plan / UC Course  I have reviewed the triage vital signs and the nursing notes.  Pertinent labs & imaging results that were available during my care of the patient were reviewed by me and considered in my medical decision making (see chart for details).  Ankle sprain reviewed.   Ankle sprain information given Final Clinical Impressions(s) / UC Diagnoses   Final diagnoses:  Sprain of anterior talofibular ligament of right ankle, initial encounter     Discharge Instructions     Use ice for 20 minutes every couple of hours Elevate to reduce swelling Take Aleve 2 in the morning and 2 at night for pain May take Tylenol in addition if needed Wear boot for comfort.  May discontinue when you can walk comfortably without it Follow-up with sports medicine if you fail to improve in a couple weeks    ED Prescriptions    None     PDMP not reviewed this encounter.   Eustace Moore, MD 05/04/20 [phone]

## 2020-05-04 NOTE — ED Triage Notes (Signed)
Patient presents to Urgent Care with complaints of r ankle pain since last night. Patient reports tripped and fell yesterday at home no loc reported but pt experienced severe pain and nausea. Pt iced area yesterday without any relief. No otc meds today. Ibuprofen taken last night.

## 2020-05-04 NOTE — Discharge Instructions (Addendum)
Use ice for 20 minutes every couple of hours Elevate to reduce swelling Take Aleve 2 in the morning and 2 at night for pain May take Tylenol in addition if needed Wear boot for comfort.  May discontinue when you can walk comfortably without it Follow-up with sports medicine if you fail to improve in a couple weeks

## 2020-05-11 ENCOUNTER — Telehealth (HOSPITAL_COMMUNITY): Payer: PRIVATE HEALTH INSURANCE | Admitting: Psychiatry

## 2020-05-11 ENCOUNTER — Other Ambulatory Visit: Payer: Self-pay

## 2020-05-11 ENCOUNTER — Telehealth (INDEPENDENT_AMBULATORY_CARE_PROVIDER_SITE_OTHER): Payer: PRIVATE HEALTH INSURANCE | Admitting: Psychiatry

## 2020-05-11 DIAGNOSIS — F331 Major depressive disorder, recurrent, moderate: Secondary | ICD-10-CM

## 2020-05-11 DIAGNOSIS — F411 Generalized anxiety disorder: Secondary | ICD-10-CM | POA: Diagnosis not present

## 2020-05-11 NOTE — Progress Notes (Signed)
BH MD/PA/NP OP Progress Note  05/11/2020 3:07 PM Siniya Lichty  MRN:  [id number] Interview was conducted by phone and I verified that I was speaking with the correct person using two identifiers. I discussed the limitations of evaluation and management by telemedicine and  the availability of in person appointments. Patient expressed understanding and agreed to proceed. Participants in the visit: patient (location - home); physician (location - home office).  Chief Complaint: Anxious, depressed.  HPI: 40yo single white female who has been depressedand anxiousfor the past several months.This stems fromthe situation she found herself in: she and the father of her5yo son are living together but this man is not involved in care of their child, is not supportive, keeps contact with other women and she finds his attitude emotionally abusive. Shelost some money in real estate transaction and has no option to move out at this time.Her family would like her to move to [facility] where her sister lives and where her mother (currently in [place]) plans to relocate. In the past shehad been prescribed Prozac 20 mg and clonazepam in the pastwith unclear benefit.Wealso triedsertralineupto 150 mg at HS and trazodone to 100 mg. Mood has not improved and she started to feel more tired. We considered adding aripiprazole for augmentation but she decided against it due to concerns about weigh gain. Ultimately we changed sertraline to duloxetine which she toleratedwell but mood has not improved much on 60 mg and she eventually stopped taking it. She remainsdepressed, tearfuland anxious. There is no hx of mania, psychosis; she is not suicidal. She stopped taking trazodone as it was causing headaches but finds lorazepam helpful. She was willing to go back on duloxetine but after we started it she developed headaches which she did not experience on this mediation in the past and eventually stopped it.We planned to  try Pristiq 50 mg - she picked it up but not started - "I felt that nothing will help with my situation so what;s the point".   Visit Diagnosis:    ICD-10-CM   1. Moderate episode of recurrent major depressive disorder (HCC)  F33.1   2. GAD (generalized anxiety disorder)  F41.1     Past Psychiatric History: Please see intake H&P.  Past Medical History:  Past Medical History:  Diagnosis Date  . Anxiety   . Chronic rectal pain   . Headache(784.0)    sinus/ migraine headaches  . Hx of varicella   . PONV (postoperative nausea and vomiting)   . Uterine polyp   . Wears contact lenses     Past Surgical History:  Procedure Laterality Date  . EVALUATION UNDER ANESTHESIA WITH HEMORRHOIDECTOMY N/A 11/15/2012   Procedure: EXAM UNDER ANESTHESIA WITH  HEMORRHOIDECTOMY;  Surgeon: Romie Levee, MD;  Location: [facility];  Service: General;  Laterality: N/A;  . HYSTEROSCOPY N/A 05/14/2013   Procedure: HYSTEROSCOPY POLYPECTOMY  DILITATIONAND CURETTAGE, INTRAOP HYSTEROSALPINGOGRAM ;  Surgeon: Fermin Schwab, MD;  Location: [facility];  Service: Gynecology;  Laterality: N/A;    Family Psychiatric History: None.  Family History:  Family History  Problem Relation Age of Onset  . Bladder Cancer Father   . Cancer Father        lung brain  . Cancer Maternal Grandfather   . Cancer Paternal Grandfather     Social History:  Social History   Socioeconomic History  . Marital status: Single    Spouse name: Not on file  . Number of children: 1  . Years of  education: Not on file  . Highest education level: Not on file  Occupational History  . Not on file  Tobacco Use  . Smoking status: Never Smoker  . Smokeless tobacco: Never Used  Vaping Use  . Vaping Use: Never used  Substance and Sexual Activity  . Alcohol use: No    Alcohol/week: 0.0 standard drinks    Comment: socially  . Drug use: No  . Sexual activity: Yes    Birth control/protection: None     Comment: patient refused   Other Topics Concern  . Not on file  Social History Narrative  . Not on file   Social Determinants of Health   Financial Resource Strain: Not on file  Food Insecurity: Not on file  Transportation Needs: Not on file  Physical Activity: Not on file  Stress: Not on file  Social Connections: Not on file    Allergies:  Allergies  Allergen Reactions  . Demerol [Meperidine] Other (See Comments)    halluninations    Metabolic Disorder Labs: No results found for: HGBA1C, MPG No results found for: PROLACTIN No results found for: CHOL, TRIG, HDL, CHOLHDL, VLDL, LDLCALC No results found for: TSH  Therapeutic Level Labs: No results found for: LITHIUM No results found for: VALPROATE No components found for:  CBMZ  Current Medications: Current Outpatient Medications  Medication Sig Dispense Refill  . desvenlafaxine (PRISTIQ) 50 MG 24 hr tablet Take 1 tablet (50 mg total) by mouth daily. 30 tablet 1  . ibuprofen (ADVIL,MOTRIN) 800 MG tablet Take 1 tablet (800 mg total) by mouth every 8 (eight) hours as needed for moderate pain. 21 tablet 0  . LORazepam (ATIVAN) 1 MG tablet Take 1 tablet (1 mg total) by mouth 2 (two) times daily as needed for anxiety. 60 tablet 2   No current facility-administered medications for this visit.    Psychiatric Specialty Exam: Review of Systems  Psychiatric/Behavioral: The patient is nervous/anxious.   All other systems reviewed and are negative.   Last menstrual period 04/27/2020.There is no height or weight on file to calculate BMI.  General Appearance: NA  Eye Contact:  NA  Speech:  Clear and Coherent and Normal Rate  Volume:  Normal  Mood:  Anxious and Depressed  Affect:  NA  Thought Process:  Goal Directed  Orientation:  Full (Time, Place, and Person)  Thought Content: Logical   Suicidal Thoughts:  No  Homicidal Thoughts:  No  Memory:  Immediate;   Good Recent;   Good Remote;   Good  Judgement:  Fair   Insight:  Good  Psychomotor Activity:  NA  Concentration:  Concentration: Good  Recall:  Good  Fund of Knowledge: Good  Language: Good  Akathisia:  Negative  Handed:  Right  AIMS (if indicated): not done  Assets:  Communication Skills Desire for Improvement Financial Resources/Insurance Housing Resilience Talents/Skills  ADL's:  Intact  Cognition: WNL  Sleep:  Fair   Screenings: Flowsheet Row ED from 05/04/2020 in [facility]  C-SSRS RISK CATEGORY No Risk       Assessment and Plan: 39yo single white female who has been depressedand anxiousfor the past several months.This stems fromthe situation she found herself in: she and the father of her5yo son are living together but this man is not involved in care of their child, is not supportive, keeps contact with other women and she finds his attitude emotionally abusive. Shelost some money in real estate transaction and has no option to  move out at this time.Her family would like her to move to [facility] where her sister lives and where her mother (currently in [place]) plans to relocate. In the past shehad been prescribed Prozac 20 mg and clonazepam in the pastwith unclear benefit.Wealso triedsertralineupto 150 mg at HS and trazodone to 100 mg. Mood has not improved and she started to feel more tired. We considered adding aripiprazole for augmentation but she decided against it due to concerns about weigh gain. Ultimately we changed sertraline to duloxetine which she toleratedwell but mood has not improved much on 60 mg and she eventually stopped taking it. She remainsdepressedand anxious. There is no hx of mania, psychosis; she is not suicidal. She stopped taking trazodone as it was causing headaches but finds lorazepam helpful. She was willing to go back on duloxetine but after we started it she developed headaches which she did not experience on this mediation in the past and eventually stopped  it.We planned to try Pristiq 50 mg - she picked it up but not started - "I felt that nothing will help with my situation so what;s the point".  Dx: MDD recurrent, moderate; GAD  Plan:She decided to try desvenlafaxine 50 mg daily and continuelorazepam 1 mg bid prn anxiety.Next visit in4 weeks with a new provider.The plan was discussed with patient who had an opportunity to ask questions and these were all answered.I spend50minutes in phone consultationwith the patient.   Magdalene Patricia, MD 05/11/2020, 3:07 PM

## 2020-05-31 ENCOUNTER — Telehealth (HOSPITAL_COMMUNITY): Payer: PRIVATE HEALTH INSURANCE | Admitting: Psychiatry

## 2020-05-31 ENCOUNTER — Other Ambulatory Visit: Payer: Self-pay

## 2020-06-30 ENCOUNTER — Encounter (HOSPITAL_COMMUNITY): Payer: Self-pay | Admitting: Psychiatry

## 2020-06-30 ENCOUNTER — Telehealth (HOSPITAL_COMMUNITY): Payer: PRIVATE HEALTH INSURANCE | Admitting: Psychiatry

## 2020-06-30 ENCOUNTER — Telehealth (INDEPENDENT_AMBULATORY_CARE_PROVIDER_SITE_OTHER): Payer: PRIVATE HEALTH INSURANCE | Admitting: Psychiatry

## 2020-06-30 ENCOUNTER — Other Ambulatory Visit: Payer: Self-pay

## 2020-06-30 DIAGNOSIS — F411 Generalized anxiety disorder: Secondary | ICD-10-CM | POA: Diagnosis not present

## 2020-06-30 DIAGNOSIS — F331 Major depressive disorder, recurrent, moderate: Secondary | ICD-10-CM | POA: Diagnosis not present

## 2020-06-30 NOTE — Progress Notes (Signed)
BH MD/PA/NP OP Progress Note  06/30/2020 1:07 PM Judith Jackson  MRN:  [id number] Interview was conducted by phone and I verified that I was speaking with the correct person using two identifiers. I discussed the limitations of evaluation and management by telemedicine and  the availability of in person appointments. Patient expressed understanding and agreed to proceed. Participants in the visit: patient (location - home); physician (location - home office).  Chief Complaint: Anxious, depressed.  HPI: Patient is a 39yo single white female who has been depressedand anxious and has been a patient of Dr.Pucilowska previously who is no longer with . She reports  being very stressed due to her life circumstances. Her job is stressful and she lives with her son's father with whom she has a toxic relationship with. She is struggling with sleep issues and appetite. She is not taking the desvenlafaxine and states she has been forgetting to take it, feels like no medications are going to help. She is not seeing a therapist and feels that will not help either. She is  Stressed about her 76 yo son and him witnessing her stress and the relationship with his father. Takes the lorazepam frequently. Denies any suicidal thoughts.   Per Dr.Pucilowska, patient's situation stems fromthe situation she found herself in: she and the father of her5yo son are living together but this man is not involved in care of their child, is not supportive, keeps contact with other women and she finds his attitude emotionally abusive. Shelost some money in real estate transaction and has no option to move out at this time.Her family would like her to move to [facility] where her sister lives and where her mother (currently in [place]) plans to relocate. In the past shehad been prescribed Prozac 20 mg and clonazepam in the pastwith unclear benefit.Wealso triedsertralineupto 150 mg at HS and trazodone to 100 mg. Mood has  not improved and she started to feel more tired. We considered adding aripiprazole for augmentation but she decided against it due to concerns about weigh gain. Ultimately we changed sertraline to duloxetine which she toleratedwell but mood has not improved much on 60 mg and she eventually stopped taking it. She remainsdepressed, tearfuland anxious. There is no hx of mania, psychosis; she is not suicidal. She stopped taking trazodone as it was causing headaches but finds lorazepam helpful. She was willing to go back on duloxetine but after we started it she developed headaches which she did not experience on this mediation in the past and eventually stopped it.We planned to try Pristiq 50 mg - she picked it up but not started - "I felt that nothing will help with my situation so what;s the point".   Visit Diagnosis:    ICD-10-CM   1. Moderate episode of recurrent major depressive disorder (HCC)  F33.1   2. GAD (generalized anxiety disorder)  F41.1     Past Psychiatric History: Please see intake H&P.  Past Medical History:  Past Medical History:  Diagnosis Date  . Anxiety   . Chronic rectal pain   . Headache(784.0)    sinus/ migraine headaches  . Hx of varicella   . PONV (postoperative nausea and vomiting)   . Uterine polyp   . Wears contact lenses     Past Surgical History:  Procedure Laterality Date  . EVALUATION UNDER ANESTHESIA WITH HEMORRHOIDECTOMY N/A 11/15/2012   Procedure: EXAM UNDER ANESTHESIA WITH  HEMORRHOIDECTOMY;  Surgeon: Romie Levee, MD;  Location: [facility];  Service:  General;  Laterality: N/A;  . HYSTEROSCOPY N/A 05/14/2013   Procedure: HYSTEROSCOPY POLYPECTOMY  DILITATIONAND CURETTAGE, INTRAOP HYSTEROSALPINGOGRAM ;  Surgeon: Fermin Schwab, MD;  Location: [facility];  Service: Gynecology;  Laterality: N/A;    Family Psychiatric History: None.  Family History:  Family History  Problem Relation Age of Onset  . Bladder Cancer  Father   . Cancer Father        lung brain  . Cancer Maternal Grandfather   . Cancer Paternal Grandfather     Social History:  Social History   Socioeconomic History  . Marital status: Single    Spouse name: Not on file  . Number of children: 1  . Years of education: Not on file  . Highest education level: Not on file  Occupational History  . Not on file  Tobacco Use  . Smoking status: Never Smoker  . Smokeless tobacco: Never Used  Vaping Use  . Vaping Use: Never used  Substance and Sexual Activity  . Alcohol use: No    Alcohol/week: 0.0 standard drinks    Comment: socially  . Drug use: No  . Sexual activity: Yes    Birth control/protection: None    Comment: patient refused   Other Topics Concern  . Not on file  Social History Narrative  . Not on file   Social Determinants of Health   Financial Resource Strain: Not on file  Food Insecurity: Not on file  Transportation Needs: Not on file  Physical Activity: Not on file  Stress: Not on file  Social Connections: Not on file    Allergies:  Allergies  Allergen Reactions  . Demerol [Meperidine] Other (See Comments)    halluninations    Metabolic Disorder Labs: No results found for: HGBA1C, MPG No results found for: PROLACTIN No results found for: CHOL, TRIG, HDL, CHOLHDL, VLDL, LDLCALC No results found for: TSH  Therapeutic Level Labs: No results found for: LITHIUM No results found for: VALPROATE No components found for:  CBMZ  Current Medications: Current Outpatient Medications  Medication Sig Dispense Refill  . desvenlafaxine (PRISTIQ) 50 MG 24 hr tablet Take 1 tablet (50 mg total) by mouth daily. 30 tablet 1  . ibuprofen (ADVIL,MOTRIN) 800 MG tablet Take 1 tablet (800 mg total) by mouth every 8 (eight) hours as needed for moderate pain. 21 tablet 0   No current facility-administered medications for this visit.    Psychiatric Specialty Exam: Review of Systems  Psychiatric/Behavioral: The patient  is nervous/anxious.   All other systems reviewed and are negative.   There were no vitals taken for this visit.There is no height or weight on file to calculate BMI.  General Appearance: NA  Eye Contact:  NA  Speech:  Clear and Coherent and Normal Rate  Volume:  Normal  Mood:  Anxious and Depressed  Affect:  NA  Thought Process:  Goal Directed  Orientation:  Full (Time, Place, and Person)  Thought Content: Logical   Suicidal Thoughts:  No  Homicidal Thoughts:  No  Memory:  Immediate;   Good Recent;   Good Remote;   Good  Judgement:  Fair  Insight:  Good  Psychomotor Activity:  NA  Concentration:  Concentration: Good  Recall:  Good  Fund of Knowledge: Good  Language: Good  Akathisia:  Negative  Handed:  Right  AIMS (if indicated): not done  Assets:  Communication Skills Desire for Improvement Financial Resources/Insurance Housing Resilience Talents/Skills  ADL's:  Intact  Cognition: WNL  Sleep:  poor   Screenings: Flowsheet Row ED from 05/04/2020 in [facility] RISK CATEGORY No Risk       Assessment and Plan: 39yo single white female who has been depressedand anxiousfor the past several months.This stems fromthe situation she found herself in: she and the father of her5yo son are living together but this man is not involved in care of their child, is not supportive, keeps contact with other women and she finds his attitude emotionally abusive. Shelost some money in real estate transaction and has no option to move out at this time.Her family would like her to move to [facility] where her sister lives and where her mother (currently in [place]) plans to relocate. In the past shehad been prescribed Prozac 20 mg and clonazepam in the pastwith unclear benefit.Wealso triedsertralineupto 150 mg at HS and trazodone to 100 mg. Mood has not improved and she started to feel more tired. We considered adding aripiprazole for  augmentation but she decided against it due to concerns about weigh gain. Ultimately we changed sertraline to duloxetine which she toleratedwell but mood has not improved much on 60 mg and she eventually stopped taking it. She remainsdepressedand anxious. There is no hx of mania, psychosis; she is not suicidal. She stopped taking trazodone as it was causing headaches but finds lorazepam helpful. She was willing to go back on duloxetine but after we started it she developed headaches which she did not experience on this mediation in the past and eventually stopped it.We planned to try Pristiq 50 mg - she picked it up but not started - "I felt that nothing will help with my situation so what;s the point".  Dx: MDD recurrent, moderate; GAD  Plan:Recommend starting the desvenlafaxine at 50mg  po daily. Patient aware of side effects and benefits. Decrease lorazepam to 0.5mg  prn anxiety not more than once daily. Discussed the dependency potential of benzodiazepines and tolerance. Recommend starting to see a therapist and start to work on her goals. Next visit in4 weeks. The plan was discussed with patient who had an opportunity to ask questions and these were all answered.I spend60minutes in phone consultationwith the patient.   30m, MD 06/30/2020, 1:07 PM

## 2020-09-09 ENCOUNTER — Telehealth (HOSPITAL_COMMUNITY): Payer: Self-pay | Admitting: *Deleted

## 2020-09-09 NOTE — Telephone Encounter (Signed)
Former pt of Dr. Hinton Dyer who has called requesting refill of the Ativan 1 mg bid prn. Script last written 02/25/20. Pt saw Dr. Daleen Bo on 06/30/20 and she discussed decreasing dose to 0.5 mg but did not write a new script. Pt has not received letter: front desk notified. Please review and advise. Thanks.

## 2020-09-10 NOTE — Telephone Encounter (Signed)
Please call the pharmacy [facility] [address] to clarify as appears she had lorazepam 1 mg twice a day picked up last month from the pharmacy.  It appears she had a visit with Ladora Daniel, PA to address the issue of anxiety and insomnia.  We also need to send a certified letter to establish care with new provider.

## 2020-09-17 ENCOUNTER — Telehealth (HOSPITAL_COMMUNITY): Payer: Self-pay | Admitting: *Deleted

## 2020-09-17 NOTE — Telephone Encounter (Signed)
Writer has made several attempts to return pt calls regarding refilling Lorazepam 1 mg, however phone goes to VM each time and VM is full.

## 2020-09-22 ENCOUNTER — Telehealth (HOSPITAL_COMMUNITY): Payer: Self-pay | Admitting: *Deleted

## 2020-09-22 MED ORDER — LORAZEPAM 1 MG PO TABS: 0.5000 mg | ORAL_TABLET | Freq: Every day | ORAL | 0 refills | Status: AC | PRN

## 2020-09-22 NOTE — Telephone Encounter (Signed)
Front desk aware. Thanks.

## 2020-09-22 NOTE — Telephone Encounter (Signed)
Former pt of Dr. Hinton Dyer calling regarding refill of Lorazepam 1 mg. Pt has not received a letter or bridge. Last prescription filled on 07/27/20 per [facility] [address]. Script was one that was written by Dr. Hinton Dyer. Pt did see Stephens Shire, PA-C for insomnia and was prescribed Vistaril and Mobic. Please review and advise.

## 2020-09-22 NOTE — Telephone Encounter (Signed)
Spoke to the pharmacist Robin at [facility] [address].  We will provide 1 mg lorazepam to take half to 1 tablet as needed daily for anxiety.  Patient must establish care with a new provider.  Please send a certified letter.

## 2020-11-08 ENCOUNTER — Other Ambulatory Visit (HOSPITAL_COMMUNITY): Payer: Self-pay | Admitting: Psychiatry

## 2021-03-27 IMAGING — DX DG ANKLE COMPLETE 3+V*R*
3 series · 3 of 3 positions shown · non-contrast
Comparison: None.

CLINICAL DATA: Right ankle pain after fall last night.

EXAM:
RIGHT ANKLE - COMPLETE 3+ VIEW

[ankle ap]
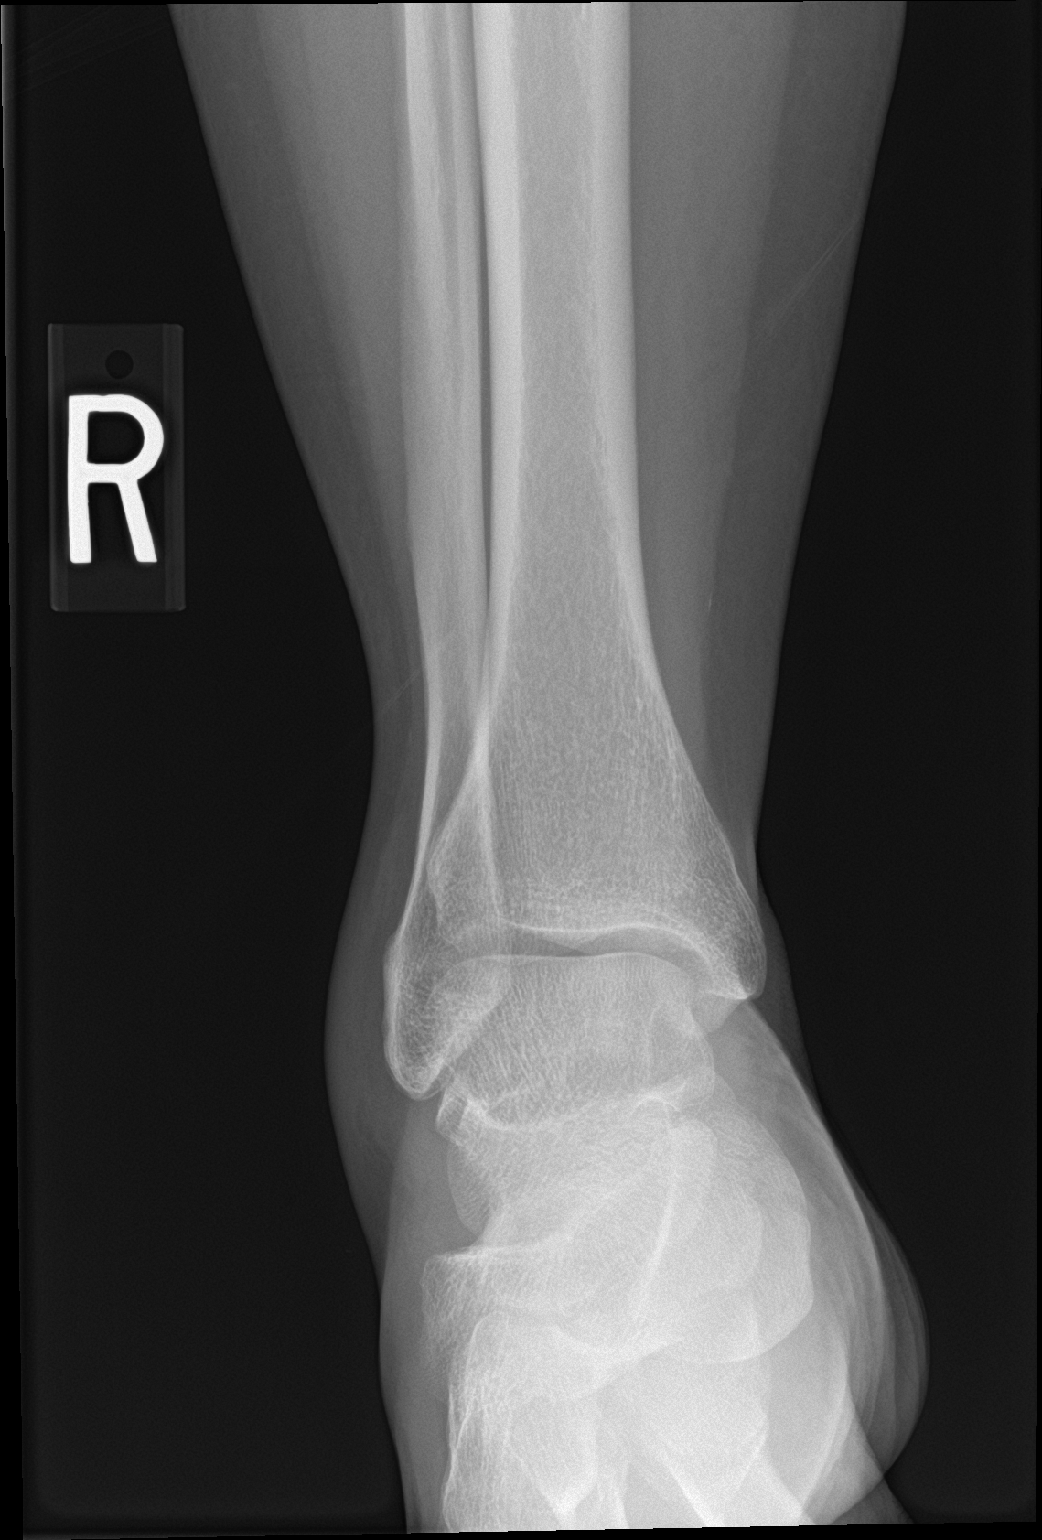

[ankle obl]
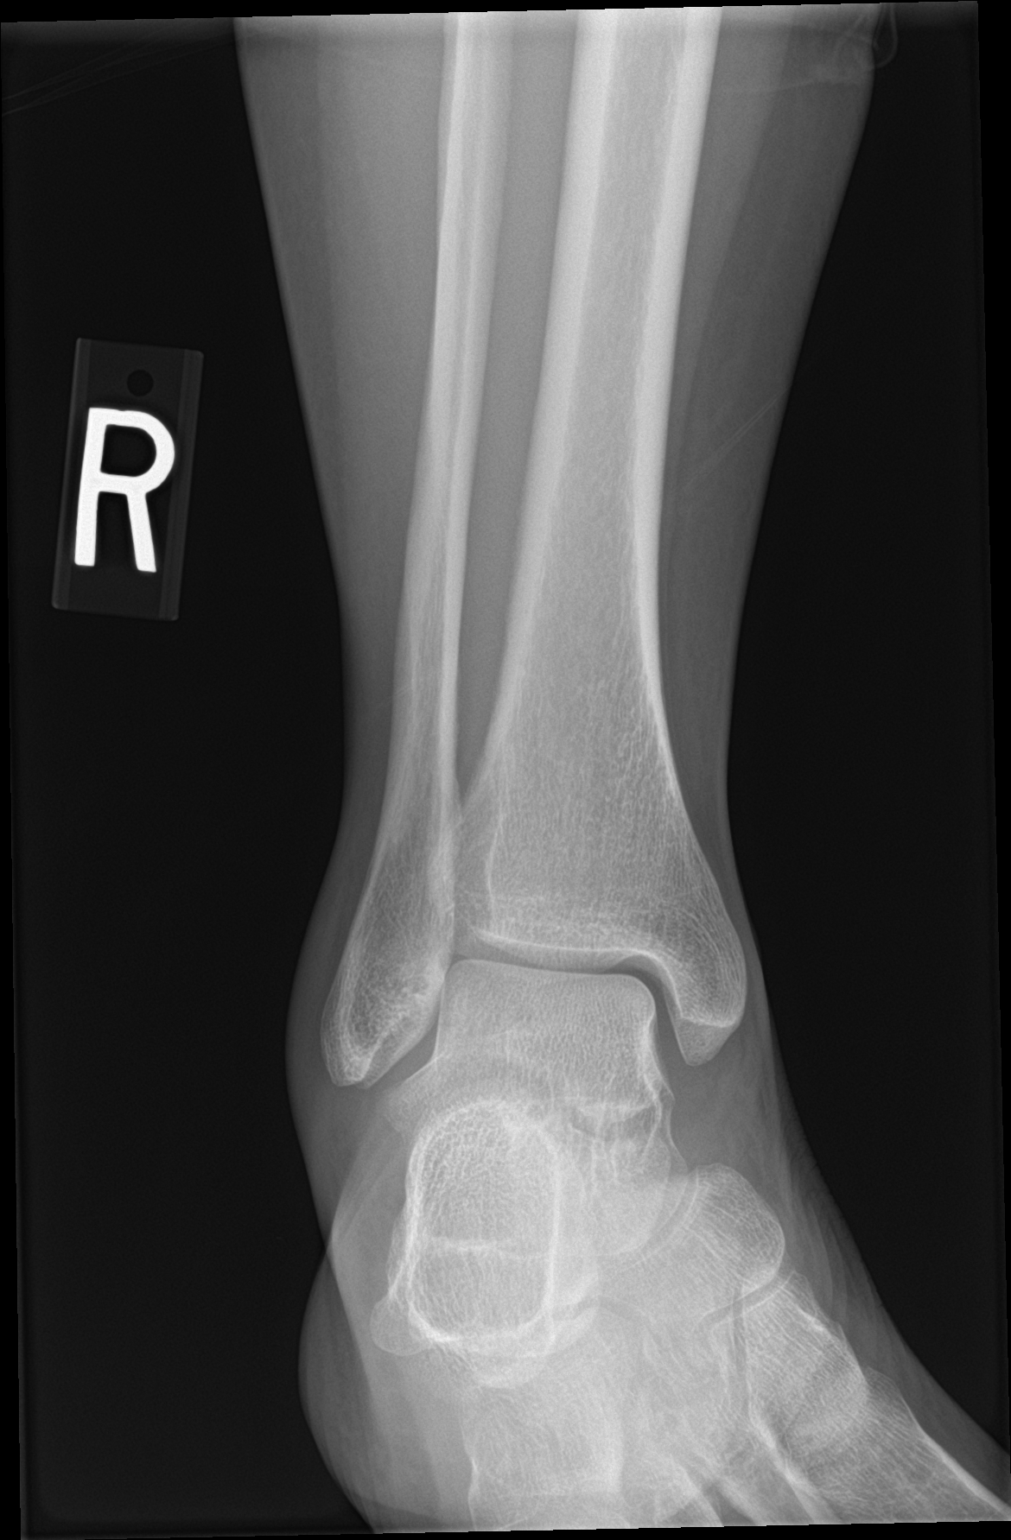

[ankle lat]
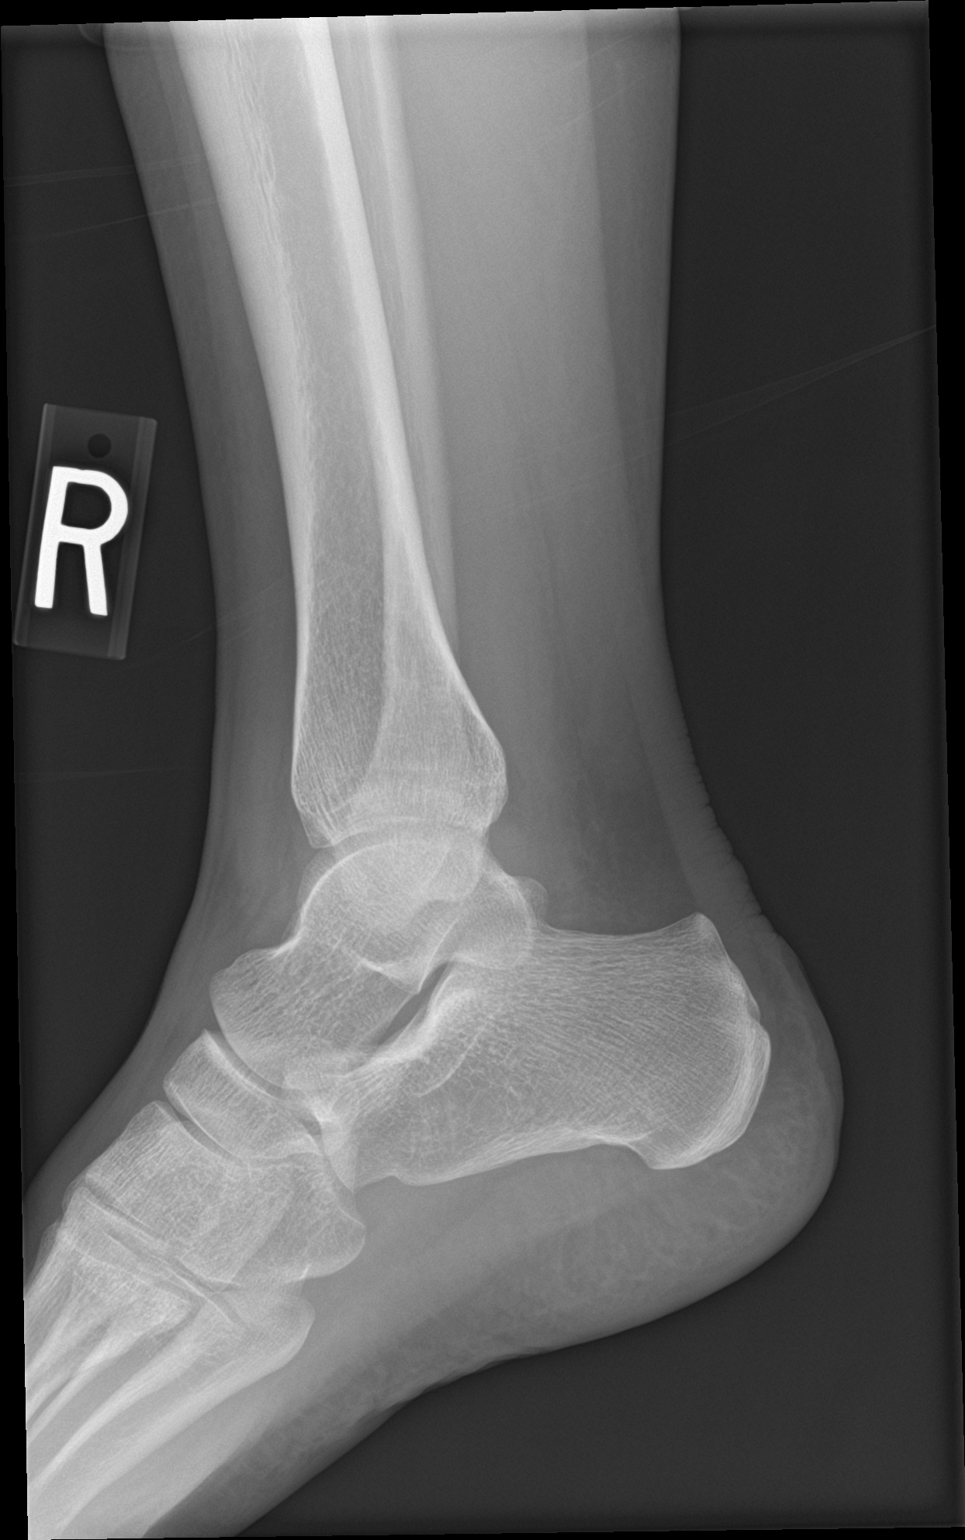

[3 of 3 positions shown; findings below may reference images not displayed]

FINDINGS: There is no evidence of fracture, dislocation, or joint effusion.
There is no evidence of arthropathy or other focal bone abnormality.
Soft tissue swelling is seen over the lateral malleolus.
IMPRESSION: No fracture or dislocation is noted. Soft tissue swelling is seen
over lateral malleolus suggesting ligamentous injury.
